# Patient Record
Sex: Male | Born: 1990 | Race: White | Hispanic: No | Marital: Single | Smoking: Never smoker
Health system: Southern US, Community
[De-identification: ages and names within clinical notes are randomized; demographics above are authoritative.]

---
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
## 2011-04-02 ENCOUNTER — Other Ambulatory Visit (INDEPENDENT_AMBULATORY_CARE_PROVIDER_SITE_OTHER): Payer: 59

## 2011-04-02 ENCOUNTER — Encounter: Payer: Self-pay | Admitting: Internal Medicine

## 2011-04-02 ENCOUNTER — Ambulatory Visit (INDEPENDENT_AMBULATORY_CARE_PROVIDER_SITE_OTHER): Payer: 59 | Admitting: Internal Medicine

## 2011-04-02 VITALS — BP 120/70 | HR 68 | Temp 98.0°F | Resp 16 | Wt 151.0 lb

## 2011-04-02 DIAGNOSIS — R209 Unspecified disturbances of skin sensation: Secondary | ICD-10-CM

## 2011-04-02 DIAGNOSIS — L708 Other acne: Secondary | ICD-10-CM

## 2011-04-02 DIAGNOSIS — R748 Abnormal levels of other serum enzymes: Secondary | ICD-10-CM

## 2011-04-02 DIAGNOSIS — L700 Acne vulgaris: Secondary | ICD-10-CM

## 2011-04-02 DIAGNOSIS — R202 Paresthesia of skin: Secondary | ICD-10-CM

## 2011-04-02 LAB — CBC WITH DIFFERENTIAL/PLATELET
Basophils Relative: 0.3 % (ref 0.0–3.0)
Eosinophils Relative: 3.6 % (ref 0.0–5.0)
HCT: 44.3 % (ref 39.0–52.0)
Lymphs Abs: 2.4 10*3/uL (ref 0.7–4.0)
MCV: 93.5 fl (ref 78.0–100.0)
Monocytes Absolute: 0.9 10*3/uL (ref 0.1–1.0)
Neutrophils Relative %: 58 % (ref 43.0–77.0)
RBC: 4.74 Mil/uL (ref 4.22–5.81)
WBC: 8.8 10*3/uL (ref 4.5–10.5)

## 2011-04-02 MED ORDER — CEPHALEXIN 500 MG PO CAPS: 500.0000 mg | ORAL_CAPSULE | Freq: Two times a day (BID) | ORAL | Status: DC

## 2011-04-02 NOTE — Progress Notes (Signed)
  Subjective:    Patient ID: Nicholas Mclean, male    DOB: March 13, 1990, 21 y.o.   MRN: [id number]  HPI    Review of Systems     Objective:   Physical Exam        Assessment & Plan:

## 2011-04-03 LAB — COMPREHENSIVE METABOLIC PANEL
CO2: 27 mEq/L (ref 19–32)
Calcium: 9.3 mg/dL (ref 8.4–10.5)
Chloride: 103 mEq/L (ref 96–112)
GFR: 110.7 mL/min (ref >60.00)
Glucose, Bld: 87 mg/dL (ref 70–99)
Sodium: 139 mEq/L (ref 135–145)
Total Bilirubin: 0.6 mg/dL (ref 0.3–1.2)
Total Protein: 7.2 g/dL (ref 6.0–8.3)

## 2011-04-04 ENCOUNTER — Telehealth: Payer: Self-pay | Admitting: Internal Medicine

## 2011-04-04 DIAGNOSIS — R748 Abnormal levels of other serum enzymes: Secondary | ICD-10-CM

## 2011-04-04 NOTE — Telephone Encounter (Signed)
Mother informed Will repeat in 10 d

## 2011-04-08 DIAGNOSIS — R202 Paresthesia of skin: Secondary | ICD-10-CM | POA: Insufficient documentation

## 2011-04-08 DIAGNOSIS — R748 Abnormal levels of other serum enzymes: Secondary | ICD-10-CM | POA: Insufficient documentation

## 2011-04-08 NOTE — Assessment & Plan Note (Signed)
D/c Erythromycin 3/13 due to paresthesia and ineffectiveness See med change

## 2011-04-08 NOTE — Progress Notes (Signed)
Patient ID: Nicholas Mclean, male   DOB: [birth year], 21 y.o.   MRN: [id number]  Subjective:    Patient ID: Nicholas Mclean, male    DOB: [birth year], 21 y.o.   MRN: [id number]  HPI C/o an episode of waking up with R foot numbness that started >24 h ago. He also had R hand numbness that resolved. He woke up this way in the morning. No pain or swelling and no injury. C/o acne x 2 years - not much better. Previous treatments with Minocycline, Doxy and Retin-A did not work. Now is on Erythromycin  Review of Systems  Constitutional: Negative for fever, chills, appetite change and fatigue.  HENT: Negative for nosebleeds, congestion, rhinorrhea, neck pain, neck stiffness and sinus pressure.   Eyes: Negative for pain and visual disturbance.  Respiratory: Negative for chest tightness and shortness of breath.   Cardiovascular: Negative for palpitations.  Gastrointestinal: Negative for abdominal pain.  Genitourinary: Negative for difficulty urinating.  Musculoskeletal: Negative for myalgias, back pain, joint swelling, arthralgias and gait problem.  Skin: Negative for color change and pallor.  Neurological: Negative for dizziness, tremors, seizures, syncope, facial asymmetry, speech difficulty, weakness, light-headedness, numbness and headaches.  Hematological: Negative for adenopathy. Does not bruise/bleed easily.  Psychiatric/Behavioral: Negative for suicidal ideas, confusion, sleep disturbance and agitation. The patient is not nervous/anxious.        Objective:   Physical Exam  Constitutional: He is oriented to person, place, and time. He appears well-developed and well-nourished.  Neurological: He is alert and oriented to person, place, and time. He has normal reflexes. He displays normal reflexes. No cranial nerve deficit. He exhibits normal muscle tone. Coordination normal.       R foot sensory exam revealed an area of decreased pinprick sensation over R lat, post and anterior heel extending laterally  and to the front of the foot   Skin: Rash noted.       Cystic acne - mostly over facial hair areas and between eyebrows The back is clear  Psychiatric: Judgment normal.   Lab Results  Component Value Date   WBC 8.8 04/02/2011   HGB 14.9 04/02/2011   HCT 44.3 04/02/2011   PLT 210.0 04/02/2011   GLUCOSE 87 04/02/2011   CHOL 131 05/24/2009   TRIG 126.0 05/24/2009   HDL 36.80* 05/24/2009   LDLCALC 69 05/24/2009   ALT 35 04/02/2011   AST 59* 04/02/2011   NA 139 04/02/2011   K 3.8 04/02/2011   CL 103 04/02/2011   CREATININE 0.9 04/02/2011   BUN 13 04/02/2011   CO2 27 04/02/2011   TSH 1.87 04/02/2011          Assessment & Plan:

## 2011-04-08 NOTE — Assessment & Plan Note (Addendum)
3/13 likely a lab artifact vs exercise induced Recheck CK, aldolase in 1 wk

## 2011-04-08 NOTE — Assessment & Plan Note (Signed)
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

## 2011-04-12 ENCOUNTER — Other Ambulatory Visit (INDEPENDENT_AMBULATORY_CARE_PROVIDER_SITE_OTHER): Payer: 59

## 2011-04-12 ENCOUNTER — Telehealth: Payer: Self-pay | Admitting: Internal Medicine

## 2011-04-12 DIAGNOSIS — R748 Abnormal levels of other serum enzymes: Secondary | ICD-10-CM

## 2011-04-12 NOTE — Telephone Encounter (Signed)
Please, inform patient's mom Celeste  that his CK is nl Thx

## 2011-04-14 LAB — ALDOLASE: Aldolase: 5.6 U/L (ref <=8.1)

## 2011-04-18 NOTE — Telephone Encounter (Signed)
Pt's father informed that CK was normal.

## 2011-06-14 ENCOUNTER — Ambulatory Visit: Payer: 59 | Admitting: Internal Medicine

## 2011-06-21 ENCOUNTER — Ambulatory Visit: Payer: 59 | Admitting: Internal Medicine

## 2011-07-20 ENCOUNTER — Ambulatory Visit (INDEPENDENT_AMBULATORY_CARE_PROVIDER_SITE_OTHER): Payer: BC Managed Care – PPO | Admitting: Internal Medicine

## 2011-07-20 ENCOUNTER — Encounter: Payer: Self-pay | Admitting: Internal Medicine

## 2011-07-20 VITALS — BP 110/70 | HR 76 | Temp 97.6°F | Resp 16 | Wt 143.0 lb

## 2011-07-20 DIAGNOSIS — Z23 Encounter for immunization: Secondary | ICD-10-CM

## 2011-07-20 DIAGNOSIS — L700 Acne vulgaris: Secondary | ICD-10-CM

## 2011-07-20 DIAGNOSIS — R202 Paresthesia of skin: Secondary | ICD-10-CM

## 2011-07-20 DIAGNOSIS — Z0000 Encounter for general adult medical examination without abnormal findings: Secondary | ICD-10-CM

## 2011-07-20 DIAGNOSIS — Z111 Encounter for screening for respiratory tuberculosis: Secondary | ICD-10-CM

## 2011-07-20 DIAGNOSIS — L708 Other acne: Secondary | ICD-10-CM

## 2011-07-20 DIAGNOSIS — R209 Unspecified disturbances of skin sensation: Secondary | ICD-10-CM

## 2011-07-20 NOTE — Progress Notes (Signed)
  Subjective:    Patient ID: Nicholas Mclean, male    DOB: 1990/05/13, 21 y.o.   MRN: [id number]  HPI  The patient is here for a wellness exam. The patient has been doing well overall without major physical or psychological issues going on lately.  Wt Readings from Last 3 Encounters:  07/20/11 143 lb (64.864 kg) (30.06%*)  04/02/11 151 lb (68.493 kg) (44.93%*)  12/04/10 149 lb (67.586 kg) (43.49%*)   * Growth percentiles are based on CDC 2-20 Years data.   BP Readings from Last 3 Encounters:  07/20/11 110/70  04/02/11 120/70  12/04/10 116/72     Review of Systems  Constitutional: Negative for appetite change, fatigue and unexpected weight change.  HENT: Negative for hearing loss, ear pain, nosebleeds, congestion, sore throat, sneezing, trouble swallowing, neck pain and neck stiffness.   Eyes: Negative for itching and visual disturbance.  Respiratory: Negative for cough, chest tightness and wheezing.   Cardiovascular: Negative for chest pain, palpitations and leg swelling.  Gastrointestinal: Negative for nausea, abdominal pain, diarrhea, blood in stool and abdominal distention.  Genitourinary: Negative for frequency, hematuria, discharge and testicular pain.  Musculoskeletal: Negative for back pain, joint swelling and gait problem.  Skin: Negative for rash and wound.  Neurological: Negative for dizziness, tremors, speech difficulty, weakness, numbness and headaches.  Psychiatric/Behavioral: Negative for suicidal ideas, disturbed wake/sleep cycle, dysphoric mood and agitation. The patient is not nervous/anxious.        Objective:   Physical Exam  Constitutional: He is oriented to person, place, and time. He appears well-developed and well-nourished.  HENT:  Mouth/Throat: Oropharynx is clear and moist.  Eyes: Conjunctivae are normal. Pupils are equal, round, and reactive to light.  Neck: Normal range of motion. No JVD present. No thyromegaly present.  Cardiovascular: Normal rate,  regular rhythm, normal heart sounds and intact distal pulses.  Exam reveals no gallop and no friction rub.   No murmur heard. Pulmonary/Chest: Effort normal and breath sounds normal. No respiratory distress. He has no wheezes. He has no rales. He exhibits no tenderness.  Abdominal: Soft. Bowel sounds are normal. He exhibits no distension and no mass. There is no tenderness. There is no rebound and no guarding.  Genitourinary:       Self testes exam was nl  Musculoskeletal: Normal range of motion. He exhibits no edema and no tenderness.  Lymphadenopathy:    He has no cervical adenopathy.  Neurological: He is alert and oriented to person, place, and time. He has normal reflexes. No cranial nerve deficit. He exhibits normal muscle tone. Coordination normal.  Skin: Skin is warm and dry. No rash noted.  Psychiatric: He has a normal mood and affect. His behavior is normal. Judgment and thought content normal.   Lab Results  Component Value Date   WBC 8.8 04/02/2011   HGB 14.9 04/02/2011   HCT 44.3 04/02/2011   PLT 210.0 04/02/2011   GLUCOSE 87 04/02/2011   CHOL 131 05/24/2009   TRIG 126.0 05/24/2009   HDL 36.80* 05/24/2009   LDLCALC 69 05/24/2009   ALT 35 04/02/2011   AST 59* 04/02/2011   NA 139 04/02/2011   K 3.8 04/02/2011   CL 103 04/02/2011   CREATININE 0.9 04/02/2011   BUN 13 04/02/2011   CO2 27 04/02/2011   TSH 1.87 04/02/2011          Assessment & Plan:

## 2011-07-24 ENCOUNTER — Ambulatory Visit: Payer: BC Managed Care – PPO

## 2011-07-24 DIAGNOSIS — Z23 Encounter for immunization: Secondary | ICD-10-CM | POA: Diagnosis not present

## 2011-07-24 LAB — TB SKIN TEST

## 2011-07-25 DIAGNOSIS — Z0000 Encounter for general adult medical examination without abnormal findings: Secondary | ICD-10-CM | POA: Insufficient documentation

## 2011-07-25 NOTE — Assessment & Plan Note (Signed)
Improved a lot 

## 2011-07-25 NOTE — Assessment & Plan Note (Signed)
We discussed age appropriate health related issues, including available/recomended screening tests and vaccinations. We discussed a need for adhering to healthy diet and exercise. Labs were reviewed/ordered. All questions were answered. Age and sex related issues discussed (safe sex, seat belt use, etc.). Gardasil suggested, info given. PPD

## 2011-07-25 NOTE — Assessment & Plan Note (Signed)
Resolved completely

## 2011-07-26 ENCOUNTER — Telehealth: Payer: Self-pay | Admitting: *Deleted

## 2011-07-26 DIAGNOSIS — Z0000 Encounter for general adult medical examination without abnormal findings: Secondary | ICD-10-CM

## 2011-07-26 NOTE — Telephone Encounter (Signed)
Received staff msg pt made apx for 07/25/12. Nedd labs entered.Marland KitchenMarland Kitchen7/11/13@2 :42pm/LMB

## 2011-07-26 NOTE — Telephone Encounter (Signed)
Message copied by Deatra James on Thu Jul 26, 2011  2:41 PM ------      Message from: COUSIN, [place] T      Created: Fri Jul 20, 2011 11:46 AM      Regarding: PHY DATE 07/25/12       THANKS

## 2011-12-31 ENCOUNTER — Encounter: Payer: Self-pay | Admitting: Internal Medicine

## 2011-12-31 ENCOUNTER — Ambulatory Visit (INDEPENDENT_AMBULATORY_CARE_PROVIDER_SITE_OTHER): Payer: BC Managed Care – PPO | Admitting: Internal Medicine

## 2011-12-31 VITALS — BP 112/72 | HR 51 | Temp 97.8°F | Ht 71.5 in | Wt 145.0 lb

## 2011-12-31 DIAGNOSIS — R05 Cough: Secondary | ICD-10-CM

## 2011-12-31 DIAGNOSIS — R059 Cough, unspecified: Secondary | ICD-10-CM

## 2011-12-31 DIAGNOSIS — J019 Acute sinusitis, unspecified: Secondary | ICD-10-CM

## 2011-12-31 MED ORDER — HYDROCODONE-HOMATROPINE 5-1.5 MG/5ML PO SYRP: 5.0000 mL | ORAL_SOLUTION | Freq: Three times a day (TID) | ORAL | Status: DC | PRN

## 2011-12-31 MED ORDER — AMOXICILLIN-POT CLAVULANATE 875-125 MG PO TABS: 1.0000 | ORAL_TABLET | Freq: Two times a day (BID) | ORAL | Status: DC

## 2011-12-31 NOTE — Progress Notes (Signed)
HPI  Pt presents to the clinic today with c/o sinus pain and pressure. This started 4 days ago. He also c/o of fever and headache. He has taken tylenol with only mild relief. He is also taking Mucinex which is causing him to cough up thick green sputum. Nothing makes it worse, it's just not getting any better. He has no history of allergies or asthma. He has had sick contacts. He did have similar symptoms 1 month ago and was treated with an antibiotic. He does not remember the name.  Review of Systems   No past medical history on file.  Family History  Problem Relation Age of Onset  . Multiple sclerosis Mother   . Melanoma Father     History   Social History  . Marital Status: Single    Spouse Name: N/A    Number of Children: N/A  . Years of Education: N/A   Occupational History  . Not on file.   Social History Main Topics  . Smoking status: Never Smoker   . Smokeless tobacco: Not on file  . Alcohol Use: No  . Drug Use: No  . Sexually Active:    Other Topics Concern  . Not on file   Social History Narrative  . No narrative on file    No Known Allergies   Constitutional: Positive headache, fatigue and fever. Denies abrupt weight changes.  HEENT:  Positive eye pain, pressure behind the eyes, facial pain, nasal congestion and sore throat. Denies eye redness, ear pain, ringing in the ears, wax buildup, runny nose or bloody nose. Respiratory: Positive cough and thick green sputum production. Denies difficulty breathing or shortness of breath.  Cardiovascular: Denies chest pain, chest tightness, palpitations or swelling in the hands or feet.   No other specific complaints in a complete review of systems (except as listed in HPI above).  Objective:    General: Appears his stated age, well developed, well nourished in NAD. HEENT: Head: normal shape and size; Eyes: sclera white, no icterus, conjunctiva pink, PERRLA and EOMs intact; Ears: Tm's gray and intact, normal light  reflex; Nose: mucosa pink and moist, septum midline; Throat/Mouth: + PND. Teeth present, mucosa pink and moist, no exudate noted, no lesions or ulcerations noted.  Neck: Mild cervical lymphadenopathy. Neck supple, trachea midline. No massses, lumps or thyromegaly present.  Cardiovascular: Normal rate and rhythm. S1,S2 noted.  No murmur, rubs or gallops noted. No JVD or BLE edema. No carotid bruits noted. Pulmonary/Chest: Normal effort and positive vesicular breath sounds. No respiratory distress. No wheezes, rales or ronchi noted.      Assessment & Plan:   Acute bacterial sinusitis  Can use a Neti Pot which can be purchased from your local drug store. Flonase 2 sprays each nostril for 3 days and then as needed. Augmentin BID for 10 days  RTC as needed or if symptoms persist.

## 2011-12-31 NOTE — Patient Instructions (Signed)

## 2012-01-22 ENCOUNTER — Emergency Department (HOSPITAL_COMMUNITY)
Admission: EM | Admit: 2012-01-22 | Discharge: 2012-01-23 | Disposition: A | Discharge status: Home / Self Care | Payer: BC Managed Care – PPO | Attending: Emergency Medicine | Admitting: Emergency Medicine

## 2012-01-22 ENCOUNTER — Emergency Department (HOSPITAL_COMMUNITY): Payer: BC Managed Care – PPO

## 2012-01-22 ENCOUNTER — Encounter (HOSPITAL_COMMUNITY): Payer: Self-pay | Admitting: *Deleted

## 2012-01-22 DIAGNOSIS — Z79899 Other long term (current) drug therapy: Secondary | ICD-10-CM | POA: Insufficient documentation

## 2012-01-22 DIAGNOSIS — S060X0A Concussion without loss of consciousness, initial encounter: Secondary | ICD-10-CM

## 2012-01-22 DIAGNOSIS — S060X9A Concussion with loss of consciousness of unspecified duration, initial encounter: Secondary | ICD-10-CM | POA: Insufficient documentation

## 2012-01-22 DIAGNOSIS — IMO0002 Reserved for concepts with insufficient information to code with codable children: Secondary | ICD-10-CM

## 2012-01-22 DIAGNOSIS — Y929 Unspecified place or not applicable: Secondary | ICD-10-CM | POA: Insufficient documentation

## 2012-01-22 DIAGNOSIS — W108XXA Fall (on) (from) other stairs and steps, initial encounter: Secondary | ICD-10-CM | POA: Insufficient documentation

## 2012-01-22 DIAGNOSIS — S0003XA Contusion of scalp, initial encounter: Secondary | ICD-10-CM | POA: Insufficient documentation

## 2012-01-22 DIAGNOSIS — S060XAA Concussion with loss of consciousness status unknown, initial encounter: Secondary | ICD-10-CM | POA: Insufficient documentation

## 2012-01-22 DIAGNOSIS — S1093XA Contusion of unspecified part of neck, initial encounter: Secondary | ICD-10-CM | POA: Insufficient documentation

## 2012-01-22 DIAGNOSIS — R51 Headache: Secondary | ICD-10-CM | POA: Insufficient documentation

## 2012-01-22 DIAGNOSIS — Y939 Activity, unspecified: Secondary | ICD-10-CM | POA: Insufficient documentation

## 2012-01-22 DIAGNOSIS — T148XXA Other injury of unspecified body region, initial encounter: Secondary | ICD-10-CM

## 2012-01-22 NOTE — ED Notes (Signed)
Pt tripped over a dog; falling down about 4 steps; presents with laceration above left eyebrow; swelling to nose; abrasions to nose/chin/forehead; c/o neck soreness; c collar placed in triage; pt seen by Herbert Seta PA in triage; pt unsure of loss of consciousness

## 2012-01-23 ENCOUNTER — Emergency Department (HOSPITAL_COMMUNITY): Payer: BC Managed Care – PPO

## 2012-01-23 MED ORDER — BACITRACIN ZINC 500 UNIT/GM EX OINT
TOPICAL_OINTMENT | Freq: Two times a day (BID) | CUTANEOUS | Status: DC
Start: 2012-01-23 — End: 2012-01-30

## 2012-01-23 MED ORDER — HYDROCODONE-ACETAMINOPHEN 5-325 MG PO TABS
ORAL_TABLET | ORAL | Status: AC
  Filled 2012-01-23: qty 2

## 2012-01-23 MED ORDER — HYDROCODONE-ACETAMINOPHEN 5-325 MG PO TABS: 1.0000 | ORAL_TABLET | Freq: Four times a day (QID) | ORAL | Status: DC | PRN

## 2012-01-23 MED ORDER — IBUPROFEN 600 MG PO TABS: 600.0000 mg | ORAL_TABLET | Freq: Four times a day (QID) | ORAL | Status: DC | PRN

## 2012-01-23 NOTE — ED Notes (Signed)
C-collar placed in triage at 2343

## 2012-01-23 NOTE — ED Provider Notes (Signed)
History     CSN: [id number]  Arrival date & time 01/22/12  2329   First MD Initiated Contact with Patient 01/23/12 0004      No chief complaint on file.   (Consider location/radiation/quality/duration/timing/severity/associated sxs/prior treatment) HPI Comments: Pt comes in with cc of fall. Pt states that he tripped over a dog, and fell face forward down the stairs. No LOC, but he does have a headaches. Pt has no nausea, vomiting, visual complains, seizures, altered mental status, loss of consciousness, new weakness, or numbness, no gait instability. No numbness, tingling. Denies intoxication.  The history is provided by the patient.    History reviewed. No pertinent past medical history.  History reviewed. No pertinent past surgical history.  Family History  Problem Relation Age of Onset  . Multiple sclerosis Mother   . Melanoma Father     History  Substance Use Topics  . Smoking status: Never Smoker   . Smokeless tobacco: Not on file  . Alcohol Use: No      Review of Systems  Constitutional: Negative for activity change and appetite change.  Respiratory: Negative for cough and shortness of breath.   Cardiovascular: Negative for chest pain.  Gastrointestinal: Negative for abdominal pain.  Genitourinary: Negative for dysuria.  Musculoskeletal: Positive for arthralgias. Negative for back pain.  Neurological: Positive for headaches.  Hematological: Does not bruise/bleed easily.    Allergies  Review of patient's allergies indicates no known allergies.  Home Medications   Current Outpatient Rx  Name  Route  Sig  Dispense  Refill  . BACITRACIN ZINC 500 UNIT/GM EX OINT   Topical   Apply topically 2 (two) times daily.   120 g   0   . HYDROCODONE-ACETAMINOPHEN 5-325 MG PO TABS   Oral   Take 1 tablet by mouth every 6 (six) hours as needed for pain.   6 tablet   0   . IBUPROFEN 600 MG PO TABS   Oral   Take 1 tablet (600 mg total) by mouth every 6 (six)  hours as needed for pain.   30 tablet   0     BP 126/60  Pulse 80  Temp 97.9 F (36.6 C)  Resp 20  SpO2 100%  Physical Exam  Nursing note and vitals reviewed. Constitutional: He is oriented to person, place, and time. He appears well-developed.  HENT:  Head: Normocephalic and atraumatic.  Eyes: Conjunctivae normal and EOM are normal. Pupils are equal, round, and reactive to light.  Neck: Normal range of motion. Neck supple.  Cardiovascular: Normal rate and regular rhythm.   Pulmonary/Chest: Effort normal and breath sounds normal.  Abdominal: Soft. Bowel sounds are normal. He exhibits no distension. There is no tenderness. There is no rebound and no guarding.  Musculoskeletal:       Head to toe evaluation shows forehead hematoma and a laceration above the left eye - about 4 cm in length, deep with no bleeding of the scalp, no facial abrasions, step offs, crepitus, no tenderness to palpation of the bilateral upper and lower extremities, no gross deformities, no chest tenderness, no pelvic pain.  Neurological: He is alert and oriented to person, place, and time.  Skin: Skin is warm.    ED Course  Procedures (including critical care time)  Labs Reviewed - No data to display Dg Cervical Spine Complete  01/23/2012  *RADIOLOGY REPORT*  Clinical Data: Fall.  Neck pain.  CERVICAL SPINE - COMPLETE 4+ VIEW  Comparison: None.  Findings: Vertebral  body height and alignment are normal. Intervertebral disc space height is maintained.  Prevertebral soft tissues appear normal.  Lung apices clear.  IMPRESSION: Negative exam.   Original Report Authenticated By: Holley Dexter, M.D.    Ct Head Wo Contrast  01/23/2012  *RADIOLOGY REPORT*  Clinical Data:  Larey Seat down steps.  Head pain.  Neck pain.  CT HEAD WITHOUT CONTRAST CT CERVICAL SPINE WITHOUT CONTRAST  Technique:  Multidetector CT imaging of the head and cervical spine was performed following the standard protocol without intravenous contrast.   Multiplanar CT image reconstructions of the cervical spine were also generated.  Comparison:   None  CT HEAD  Findings: There is no evidence for acute infarction, intracranial hemorrhage, mass lesion, hydrocephalus, or extra-axial fluid. There is no atrophy or white matter disease.  Calvarium is intact. There is a left frontal supraorbital scalp hematoma without underlying sinus opacity or visible orbital injury.  IMPRESSION: Left frontal scalp hematoma.  No skull fracture or intracranial hemorrhage.  CT CERVICAL SPINE  Findings: No visible cervical spine fracture or traumatic subluxation.  No intraspinal hematoma or traumatic subluxation. The  airway is midline.  No neck masses.  Clear lung apices. Intervertebral disc spaces are maintained throughout. Craniocervical junction unremarkable.  IMPRESSION: Negative.   Original Report Authenticated By: Davonna Belling, M.D.    Ct Cervical Spine Wo Contrast  01/23/2012  *RADIOLOGY REPORT*  Clinical Data:  Larey Seat down steps.  Head pain.  Neck pain.  CT HEAD WITHOUT CONTRAST CT CERVICAL SPINE WITHOUT CONTRAST  Technique:  Multidetector CT imaging of the head and cervical spine was performed following the standard protocol without intravenous contrast.  Multiplanar CT image reconstructions of the cervical spine were also generated.  Comparison:   None  CT HEAD  Findings: There is no evidence for acute infarction, intracranial hemorrhage, mass lesion, hydrocephalus, or extra-axial fluid. There is no atrophy or white matter disease.  Calvarium is intact. There is a left frontal supraorbital scalp hematoma without underlying sinus opacity or visible orbital injury.  IMPRESSION: Left frontal scalp hematoma.  No skull fracture or intracranial hemorrhage.  CT CERVICAL SPINE  Findings: No visible cervical spine fracture or traumatic subluxation.  No intraspinal hematoma or traumatic subluxation. The  airway is midline.  No neck masses.  Clear lung apices. Intervertebral disc spaces are  maintained throughout. Craniocervical junction unremarkable.  IMPRESSION: Negative.   Original Report Authenticated By: Davonna Belling, M.D.      1. Fall down stairs   2. Laceration   3. Contusion   4. Hematoma   5. Concussion with no loss of consciousness       MDM  Pt comes in after a fall. CT head and spine ordered per [place] head rules and NEXUS criteria. Will need lac repair. UTD with tetanus.   LACERATION REPAIR Performed by: Derwood Kaplan Authorized by: Derwood Kaplan Consent: Verbal consent obtained. Risks and benefits: risks, benefits and alternatives were discussed Consent given by: patient Patient identity confirmed: provided demographic data Prepped and Draped in normal sterile fashion Wound explored  Laceration Location: left forehead  Laceration Length: 4cm  No Foreign Bodies seen or palpated  Anesthesia: local infiltration  Local anesthetic: lidocaine 1% with epinephrine  Anesthetic total: 2 ml  Irrigation method: syringe Amount of cleaning: standard  Skin closure: Nylon, 5-0  Number of sutures: 5  Technique: Simple interrupted  Patient tolerance: Patient tolerated the procedure well with no immediate complications.  Derwood Kaplan, MD 01/23/12 [phone]

## 2012-01-25 ENCOUNTER — Encounter: Payer: Self-pay | Admitting: Internal Medicine

## 2012-01-30 ENCOUNTER — Ambulatory Visit (INDEPENDENT_AMBULATORY_CARE_PROVIDER_SITE_OTHER): Payer: BC Managed Care – PPO | Admitting: Internal Medicine

## 2012-01-30 ENCOUNTER — Encounter: Payer: Self-pay | Admitting: Internal Medicine

## 2012-01-30 VITALS — BP 110/60 | HR 80 | Temp 97.9°F | Resp 16 | Wt 144.0 lb

## 2012-01-30 DIAGNOSIS — S060XAA Concussion with loss of consciousness status unknown, initial encounter: Secondary | ICD-10-CM | POA: Insufficient documentation

## 2012-01-30 DIAGNOSIS — R5383 Other fatigue: Secondary | ICD-10-CM

## 2012-01-30 DIAGNOSIS — F988 Other specified behavioral and emotional disorders with onset usually occurring in childhood and adolescence: Secondary | ICD-10-CM

## 2012-01-30 DIAGNOSIS — R5381 Other malaise: Secondary | ICD-10-CM

## 2012-01-30 DIAGNOSIS — S060X9A Concussion with loss of consciousness of unspecified duration, initial encounter: Secondary | ICD-10-CM | POA: Insufficient documentation

## 2012-01-30 MED ORDER — VITAMIN D 1000 UNITS PO TABS: 1000.0000 [IU] | ORAL_TABLET | Freq: Every day | ORAL | Status: DC

## 2012-01-30 MED ORDER — PHOSPHATIDYLSERINE-DHA-EPA 100-19.5-6.5 MG PO CAPS: 1.0000 | ORAL_CAPSULE | Freq: Every day | ORAL | Status: DC

## 2012-01-30 NOTE — Assessment & Plan Note (Signed)
Will discuss Rx

## 2012-01-30 NOTE — Assessment & Plan Note (Signed)
Discussed Start Vayacase qd x 2 mo Rest more No alcohol x 2-3 mo

## 2012-01-30 NOTE — Progress Notes (Signed)
  Subjective:   HPI  Nicholas Mclean is here to f/u on his concussion - s/p fall down the steps  On his face (tripped over a dog). C/o lack of focus in class, daydreeming and poor memory x long time.  Wt Readings from Last 3 Encounters:  01/30/12 144 lb (65.318 kg)  12/31/11 145 lb (65.772 kg)  07/20/11 143 lb (64.864 kg) (30.06%*)   * Growth percentiles are based on CDC 2-20 Years data.   BP Readings from Last 3 Encounters:  01/30/12 110/60  01/23/12 115/69  12/31/11 112/72     Review of Systems  Constitutional: Negative for appetite change, fatigue and unexpected weight change.  HENT: Negative for hearing loss, ear pain, nosebleeds, congestion, sore throat, sneezing, trouble swallowing, neck pain and neck stiffness.   Eyes: Negative for itching and visual disturbance.  Respiratory: Negative for cough, chest tightness and wheezing.   Cardiovascular: Negative for chest pain, palpitations and leg swelling.  Gastrointestinal: Negative for nausea, abdominal pain, diarrhea, blood in stool and abdominal distention.  Genitourinary: Negative for frequency, hematuria, discharge and testicular pain.  Musculoskeletal: Negative for back pain, joint swelling and gait problem.  Skin: Negative for rash and wound.  Neurological: Negative for dizziness, tremors, speech difficulty, weakness, numbness and headaches.  Psychiatric/Behavioral: Positive for decreased concentration. Negative for suicidal ideas, sleep disturbance, dysphoric mood and agitation. The patient is not nervous/anxious.        Denies depression       Objective:   Physical Exam  Constitutional: He is oriented to person, place, and time. He appears well-developed and well-nourished.  HENT:  Mouth/Throat: Oropharynx is clear and moist.  Eyes: Conjunctivae normal are normal. Pupils are equal, round, and reactive to light.  Neck: Normal range of motion. No JVD present. No thyromegaly present.  Cardiovascular: Normal rate, regular  rhythm, normal heart sounds and intact distal pulses.  Exam reveals no gallop and no friction rub.   No murmur heard. Pulmonary/Chest: Effort normal and breath sounds normal. No respiratory distress. He has no wheezes. He has no rales. He exhibits no tenderness.  Abdominal: Soft. Bowel sounds are normal. He exhibits no distension and no mass. There is no tenderness. There is no rebound and no guarding.  Genitourinary:       Self testes exam was nl  Musculoskeletal: Normal range of motion. He exhibits no edema and no tenderness.  Lymphadenopathy:    He has no cervical adenopathy.  Neurological: He is alert and oriented to person, place, and time. He has normal reflexes. No cranial nerve deficit. He exhibits normal muscle tone. Coordination normal.  Skin: Skin is warm and dry. No rash noted.  Psychiatric: He has a normal mood and affect. His behavior is normal. Judgment and thought content normal.  bruised face - resolving, laceration on L eyebrow  - healing well Lab Results  Component Value Date   WBC 8.8 04/02/2011   HGB 14.9 04/02/2011   HCT 44.3 04/02/2011   PLT 210.0 04/02/2011   GLUCOSE 87 04/02/2011   CHOL 131 05/24/2009   TRIG 126.0 05/24/2009   HDL 36.80* 05/24/2009   LDLCALC 69 05/24/2009   ALT 35 04/02/2011   AST 59* 04/02/2011   NA 139 04/02/2011   K 3.8 04/02/2011   CL 103 04/02/2011   CREATININE 0.9 04/02/2011   BUN 13 04/02/2011   CO2 27 04/02/2011   TSH 1.87 04/02/2011          Assessment & Plan:

## 2012-01-30 NOTE — Assessment & Plan Note (Signed)
Labs

## 2012-03-01 ENCOUNTER — Other Ambulatory Visit: Payer: Self-pay

## 2012-03-05 ENCOUNTER — Encounter: Payer: Self-pay | Admitting: Internal Medicine

## 2012-03-05 ENCOUNTER — Ambulatory Visit (INDEPENDENT_AMBULATORY_CARE_PROVIDER_SITE_OTHER): Payer: BC Managed Care – PPO | Admitting: Internal Medicine

## 2012-03-05 VITALS — BP 120/70 | HR 76 | Temp 98.5°F | Resp 16 | Wt 146.0 lb

## 2012-03-05 DIAGNOSIS — R5381 Other malaise: Secondary | ICD-10-CM

## 2012-03-05 DIAGNOSIS — L700 Acne vulgaris: Secondary | ICD-10-CM

## 2012-03-05 DIAGNOSIS — L708 Other acne: Secondary | ICD-10-CM

## 2012-03-05 DIAGNOSIS — F988 Other specified behavioral and emotional disorders with onset usually occurring in childhood and adolescence: Secondary | ICD-10-CM

## 2012-03-05 DIAGNOSIS — R5383 Other fatigue: Secondary | ICD-10-CM

## 2012-03-05 DIAGNOSIS — S060X9A Concussion with loss of consciousness of unspecified duration, initial encounter: Secondary | ICD-10-CM

## 2012-03-05 MED ORDER — DEXMETHYLPHENIDATE HCL 2.5 MG PO TABS: 2.5000 mg | ORAL_TABLET | Freq: Two times a day (BID) | ORAL | Status: DC

## 2012-03-05 MED ORDER — ERYTHROMYCIN BASE 500 MG PO TABS: 500.0000 mg | ORAL_TABLET | Freq: Two times a day (BID) | ORAL | Status: AC

## 2012-03-05 NOTE — Progress Notes (Signed)
   Subjective:   HPI  Nature is here to f/u on his concussion - s/p fall down the steps on his face (tripped over a dog) on 01/22/12. C/o lack of focus in class, daydreeming and poor memory x long time. No HAs. He went skiing - ok C/o acne - he did well on Erythromycin in the past  Wt Readings from Last 3 Encounters:  03/05/12 146 lb (66.225 kg)  01/30/12 144 lb (65.318 kg)  12/31/11 145 lb (65.772 kg)   BP Readings from Last 3 Encounters:  03/05/12 120/70  01/30/12 110/60  01/23/12 115/69     Review of Systems  Constitutional: Negative for appetite change, fatigue and unexpected weight change.  HENT: Negative for hearing loss, ear pain, nosebleeds, congestion, sore throat, sneezing, trouble swallowing, neck pain and neck stiffness.   Eyes: Negative for itching and visual disturbance.  Respiratory: Negative for cough, chest tightness and wheezing.   Cardiovascular: Negative for chest pain, palpitations and leg swelling.  Gastrointestinal: Negative for nausea, abdominal pain, diarrhea, blood in stool and abdominal distention.  Genitourinary: Negative for frequency, hematuria, discharge and testicular pain.  Musculoskeletal: Negative for back pain, joint swelling and gait problem.  Skin: Negative for rash and wound.  Neurological: Negative for dizziness, tremors, speech difficulty, weakness, numbness and headaches.  Psychiatric/Behavioral: Positive for decreased concentration. Negative for suicidal ideas, sleep disturbance, dysphoric mood and agitation. The patient is not nervous/anxious.        Denies depression       Objective:   Physical Exam  Constitutional: He is oriented to person, place, and time. He appears well-developed and well-nourished.  HENT:  Mouth/Throat: Oropharynx is clear and moist.  Eyes: Conjunctivae are normal. Pupils are equal, round, and reactive to light.  Neck: Normal range of motion. No JVD present. No thyromegaly present.  Cardiovascular: Normal  rate, regular rhythm, normal heart sounds and intact distal pulses.  Exam reveals no gallop and no friction rub.   No murmur heard. Pulmonary/Chest: Effort normal and breath sounds normal. No respiratory distress. He has no wheezes. He has no rales. He exhibits no tenderness.  Abdominal: Soft. Bowel sounds are normal. He exhibits no distension and no mass. There is no tenderness. There is no rebound and no guarding.  Genitourinary:  Self testes exam was nl  Musculoskeletal: Normal range of motion. He exhibits no edema and no tenderness.  Lymphadenopathy:    He has no cervical adenopathy.  Neurological: He is alert and oriented to person, place, and time. He has normal reflexes. No cranial nerve deficit. He exhibits normal muscle tone. Coordination normal.  Skin: Skin is warm and dry. No rash noted.  Psychiatric: He has a normal mood and affect. His behavior is normal. Judgment and thought content normal.   laceration on L eyebrow  - healed well acne Lab Results  Component Value Date   WBC 8.8 04/02/2011   HGB 14.9 04/02/2011   HCT 44.3 04/02/2011   PLT 210.0 04/02/2011   GLUCOSE 87 04/02/2011   CHOL 131 05/24/2009   TRIG 126.0 05/24/2009   HDL 36.80* 05/24/2009   LDLCALC 69 05/24/2009   ALT 35 04/02/2011   AST 59* 04/02/2011   NA 139 04/02/2011   K 3.8 04/02/2011   CL 103 04/02/2011   CREATININE 0.9 04/02/2011   BUN 13 04/02/2011   CO2 27 04/02/2011   TSH 1.87 04/02/2011          Assessment & Plan:

## 2012-03-05 NOTE — Assessment & Plan Note (Signed)
   Potential benefits of a long term stimulants use as well as potential risks  and complications were explained to the patient and were aknowledged. Start Focalin

## 2012-03-05 NOTE — Assessment & Plan Note (Signed)
Recovering well 

## 2012-03-05 NOTE — Assessment & Plan Note (Addendum)
Worse off Rx. Re-start Erythromycin

## 2012-03-05 NOTE — Assessment & Plan Note (Signed)
Better  

## 2012-04-23 ENCOUNTER — Telehealth: Payer: Self-pay | Admitting: Internal Medicine

## 2012-04-23 MED ORDER — DEXMETHYLPHENIDATE HCL ER 5 MG PO CP24: 5.0000 mg | ORAL_CAPSULE | Freq: Every day | ORAL | Status: DC

## 2012-04-23 NOTE — Telephone Encounter (Signed)
Needs focalin - would like to have ER done

## 2012-04-24 NOTE — Telephone Encounter (Signed)
Pt's mother informed.  

## 2012-05-05 ENCOUNTER — Ambulatory Visit: Payer: BC Managed Care – PPO | Admitting: Internal Medicine

## 2012-05-05 DIAGNOSIS — Z0289 Encounter for other administrative examinations: Secondary | ICD-10-CM

## 2012-07-25 ENCOUNTER — Encounter: Payer: Self-pay | Admitting: Internal Medicine

## 2012-07-25 ENCOUNTER — Other Ambulatory Visit (INDEPENDENT_AMBULATORY_CARE_PROVIDER_SITE_OTHER): Payer: BC Managed Care – PPO

## 2012-07-25 ENCOUNTER — Ambulatory Visit (INDEPENDENT_AMBULATORY_CARE_PROVIDER_SITE_OTHER): Payer: BC Managed Care – PPO | Admitting: Internal Medicine

## 2012-07-25 VITALS — BP 118/70 | HR 76 | Temp 97.3°F | Resp 16 | Ht 72.0 in | Wt 145.0 lb

## 2012-07-25 DIAGNOSIS — Z0000 Encounter for general adult medical examination without abnormal findings: Secondary | ICD-10-CM

## 2012-07-25 DIAGNOSIS — S060X9A Concussion with loss of consciousness of unspecified duration, initial encounter: Secondary | ICD-10-CM

## 2012-07-25 DIAGNOSIS — R5381 Other malaise: Secondary | ICD-10-CM

## 2012-07-25 DIAGNOSIS — F988 Other specified behavioral and emotional disorders with onset usually occurring in childhood and adolescence: Secondary | ICD-10-CM

## 2012-07-25 DIAGNOSIS — R5383 Other fatigue: Secondary | ICD-10-CM

## 2012-07-25 LAB — BASIC METABOLIC PANEL
CO2: 28 mEq/L (ref 19–32)
Calcium: 10.9 mg/dL — ABNORMAL HIGH (ref 8.4–10.5)
Chloride: 96 mEq/L (ref 96–112)
Glucose, Bld: 92 mg/dL (ref 70–99)
Sodium: 144 mEq/L (ref 135–145)

## 2012-07-25 LAB — CBC WITH DIFFERENTIAL/PLATELET
Basophils Absolute: 0 10*3/uL (ref 0.0–0.1)
Basophils Relative: 0.3 % (ref 0.0–3.0)
Eosinophils Absolute: 0.3 10*3/uL (ref 0.0–0.7)
HCT: 45.6 % (ref 39.0–52.0)
Hemoglobin: 15.8 g/dL (ref 13.0–17.0)
Lymphocytes Relative: 25.1 % (ref 12.0–46.0)
Lymphs Abs: 2.1 10*3/uL (ref 0.7–4.0)
MCHC: 34.6 g/dL (ref 30.0–36.0)
MCV: 93.4 fl (ref 78.0–100.0)
Monocytes Absolute: 0.8 10*3/uL (ref 0.1–1.0)
Neutro Abs: 5.3 10*3/uL (ref 1.4–7.7)
RBC: 4.88 Mil/uL (ref 4.22–5.81)
RDW: 13.2 % (ref 11.5–14.6)

## 2012-07-25 LAB — URINALYSIS
Hgb urine dipstick: NEGATIVE
Ketones, ur: NEGATIVE
Total Protein, Urine: NEGATIVE
Urine Glucose: NEGATIVE
Urobilinogen, UA: 0.2 (ref 0.0–1.0)

## 2012-07-25 LAB — HEPATIC FUNCTION PANEL
ALT: 17 U/L (ref 0–53)
Albumin: 4.7 g/dL (ref 3.5–5.2)
Alkaline Phosphatase: 59 U/L (ref 39–117)
Total Protein: 7.6 g/dL (ref 6.0–8.3)

## 2012-07-25 LAB — LIPID PANEL: HDL: 53.8 mg/dL (ref >39.00)

## 2012-07-25 NOTE — Progress Notes (Signed)
   Subjective:    HPI  The patient is here for a wellness exam.   The patient has been doing well overall without major physical or psychological issues going on lately.  Wt Readings from Last 3 Encounters:  07/25/12 145 lb (65.772 kg)  03/05/12 146 lb (66.225 kg)  01/30/12 144 lb (65.318 kg)   BP Readings from Last 3 Encounters:  07/25/12 118/70  03/05/12 120/70  01/30/12 110/60     Review of Systems  Constitutional: Negative for appetite change, fatigue and unexpected weight change.  HENT: Negative for hearing loss, ear pain, nosebleeds, congestion, sore throat, sneezing, trouble swallowing, neck pain and neck stiffness.   Eyes: Negative for itching and visual disturbance.  Respiratory: Negative for cough, chest tightness and wheezing.   Cardiovascular: Negative for chest pain, palpitations and leg swelling.  Gastrointestinal: Negative for nausea, abdominal pain, diarrhea, blood in stool and abdominal distention.  Genitourinary: Negative for frequency, hematuria, discharge and testicular pain.  Musculoskeletal: Negative for back pain, joint swelling and gait problem.  Skin: Negative for rash and wound.  Neurological: Negative for dizziness, tremors, speech difficulty, weakness, numbness and headaches.  Psychiatric/Behavioral: Negative for suicidal ideas, behavioral problems, confusion, sleep disturbance, dysphoric mood, decreased concentration and agitation. The patient is not nervous/anxious.        Objective:   Physical Exam  Constitutional: He is oriented to person, place, and time. He appears well-developed and well-nourished.  Looks well  HENT:  Mouth/Throat: Oropharynx is clear and moist.  Eyes: Conjunctivae are normal. Pupils are equal, round, and reactive to light.  Neck: Normal range of motion. No JVD present. No thyromegaly present.  Cardiovascular: Normal rate, regular rhythm, normal heart sounds and intact distal pulses.  Exam reveals no gallop and no  friction rub.   No murmur heard. Pulmonary/Chest: Effort normal and breath sounds normal. No respiratory distress. He has no wheezes. He has no rales. He exhibits no tenderness.  Abdominal: Soft. Bowel sounds are normal. He exhibits no distension and no mass. There is no tenderness. There is no rebound and no guarding.  Genitourinary:  Self testes exam was nl  Musculoskeletal: Normal range of motion. He exhibits no edema and no tenderness.  Lymphadenopathy:    He has no cervical adenopathy.  Neurological: He is alert and oriented to person, place, and time. He has normal reflexes. No cranial nerve deficit. He exhibits normal muscle tone. Coordination normal.  Skin: Skin is warm and dry. No rash noted.  Psychiatric: He has a normal mood and affect. His behavior is normal. Judgment and thought content normal.   Lab Results  Component Value Date   WBC 8.8 04/02/2011   HGB 14.9 04/02/2011   HCT 44.3 04/02/2011   PLT 210.0 04/02/2011   GLUCOSE 87 04/02/2011   CHOL 131 05/24/2009   TRIG 126.0 05/24/2009   HDL 36.80* 05/24/2009   LDLCALC 69 05/24/2009   ALT 35 04/02/2011   AST 59* 04/02/2011   NA 139 04/02/2011   K 3.8 04/02/2011   CL 103 04/02/2011   CREATININE 0.9 04/02/2011   BUN 13 04/02/2011   CO2 27 04/02/2011   TSH 1.87 04/02/2011          Assessment & Plan:

## 2012-07-27 ENCOUNTER — Encounter: Payer: Self-pay | Admitting: Internal Medicine

## 2012-07-27 NOTE — Assessment & Plan Note (Signed)
Sx's resolved °

## 2012-07-27 NOTE — Assessment & Plan Note (Signed)
We discussed age appropriate health related issues, including available/recomended screening tests and vaccinations. We discussed a need for adhering to healthy diet and exercise. Labs were reviewed/ordered. All questions were answered. Age and sex related issues discussed (safe sex, seat belt use, etc.). 

## 2012-07-27 NOTE — Assessment & Plan Note (Signed)
No complains today - off rx

## 2012-07-27 NOTE — Assessment & Plan Note (Signed)
Resolved

## 2012-11-20 ENCOUNTER — Other Ambulatory Visit: Payer: Self-pay

## 2013-07-28 ENCOUNTER — Encounter: Payer: BC Managed Care – PPO | Admitting: Internal Medicine

## 2013-12-09 ENCOUNTER — Ambulatory Visit (INDEPENDENT_AMBULATORY_CARE_PROVIDER_SITE_OTHER): Payer: BC Managed Care – PPO | Admitting: Internal Medicine

## 2013-12-09 ENCOUNTER — Encounter: Payer: Self-pay | Admitting: Internal Medicine

## 2013-12-09 VITALS — BP 112/70 | HR 62 | Temp 98.3°F | Wt 141.0 lb

## 2013-12-09 DIAGNOSIS — F411 Generalized anxiety disorder: Secondary | ICD-10-CM

## 2013-12-09 DIAGNOSIS — G8929 Other chronic pain: Secondary | ICD-10-CM

## 2013-12-09 DIAGNOSIS — R1013 Epigastric pain: Secondary | ICD-10-CM

## 2013-12-09 MED ORDER — RANITIDINE HCL 150 MG PO TABS: 150.0000 mg | ORAL_TABLET | Freq: Two times a day (BID) | ORAL | Status: DC

## 2013-12-09 MED ORDER — VITAMIN D 1000 UNITS PO TABS: 1000.0000 [IU] | ORAL_TABLET | Freq: Every day | ORAL | Status: AC

## 2013-12-09 MED ORDER — BUSPIRONE HCL 10 MG PO TABS: 10.0000 mg | ORAL_TABLET | Freq: Two times a day (BID) | ORAL | Status: DC

## 2013-12-09 NOTE — Progress Notes (Signed)
Pre visit review using our clinic review tool, if applicable. No additional management support is needed unless otherwise documented below in the visit note. 

## 2013-12-09 NOTE — Patient Instructions (Signed)
Eat well Start exercising Take Ranitidine

## 2013-12-09 NOTE — Progress Notes (Signed)
Subjective:    Abdominal Pain This is a recurrent problem. The current episode started more than 1 month ago (since summer 2015). The onset quality is undetermined. The problem occurs 2 to 4 times per day. The most recent episode lasted 2 hours. The problem has been waxing and waning. The pain is located in the generalized abdominal region. The pain is at a severity of 5/10. The pain is moderate. The quality of the pain is cramping and aching. The abdominal pain does not radiate. Associated symptoms include anorexia, nausea, vomiting and weight loss. Pertinent negatives include no belching, diarrhea, frequency, headaches, hematochezia, hematuria or melena. The pain is aggravated by vomiting. He has tried antacids for the symptoms. The treatment provided mild relief. Prior diagnostic workup includes GI consult, upper endoscopy, ultrasound and CT scan (HIDA). There is no history of abdominal surgery, colon cancer, Crohn's disease, gallstones, GERD, pancreatitis, PUD or ulcerative colitis.  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 Negative for appetite change, fatigue and unexpected weight change.  HENT: Negative for congestion, ear pain, hearing loss, nosebleeds, sneezing, sore throat and trouble swallowing.   Eyes: Negative for itching and visual disturbance.  Respiratory: Negative for cough, chest tightness and wheezing.   Cardiovascular: Negative for chest pain, palpitations and leg swelling.  Gastrointestinal: Positive for nausea, vomiting and anorexia. Negative for abdominal pain, diarrhea, blood in stool, melena, hematochezia and abdominal distention.  Genitourinary: Negative for frequency,  hematuria, discharge and testicular pain.  Musculoskeletal: Negative for back pain, joint swelling, gait problem, neck pain and neck stiffness.  Skin: Negative for rash and wound.  Neurological: Negative for dizziness, tremors, speech difficulty, weakness, numbness and headaches.  Psychiatric/Behavioral: Negative for suicidal ideas, behavioral problems, confusion, sleep disturbance, dysphoric mood, decreased concentration and agitation. The patient is not nervous/anxious.        Objective:   Physical Exam  Constitutional: He is oriented to person, place, and time. He appears well-developed. No distress.  Thin NAD  HENT:  Mouth/Throat: Oropharynx is clear and moist.  Eyes: Conjunctivae are normal. Pupils are equal, round, and reactive to light.  Neck: Normal range of motion. No JVD present. No thyromegaly present.  Cardiovascular: Normal rate, regular rhythm, normal heart sounds and intact distal pulses.  Exam reveals no gallop and no friction rub.   No murmur heard. Pulmonary/Chest: Effort normal and breath sounds normal. No respiratory distress. He has no wheezes. He has no rales. He exhibits no tenderness.  Abdominal: Soft. Bowel sounds are normal. He exhibits no distension and no mass. There is no tenderness. There is no rebound and no guarding.  Musculoskeletal: Normal range of motion. He exhibits no edema or tenderness.  Lymphadenopathy:    He has no cervical adenopathy.  Neurological: He is alert and oriented to person, place, and time. He has normal reflexes. No cranial nerve deficit. He exhibits normal muscle tone. He displays a negative Romberg sign. Coordination and gait normal.  No meningeal signs  Skin: Skin is warm and dry. No rash noted.  Psychiatric: He has a normal mood and affect. His behavior is normal. Judgment and thought content normal.   Lab Results  Component Value Date   WBC 8.5 07/25/2012   HGB 15.8 07/25/2012   HCT 45.6 07/25/2012   PLT 200.0 07/25/2012    GLUCOSE 92 07/25/2012   CHOL 149 07/25/2012   TRIG 73.0 07/25/2012   HDL 53.80 07/25/2012   LDLCALC 81 07/25/2012   ALT 17 07/25/2012   AST 19 07/25/2012   NA 144 07/25/2012   K 4.1 07/25/2012   CL 96 07/25/2012   CREATININE 1.1 07/25/2012   BUN 15 07/25/2012   CO2 28 07/25/2012   TSH 3.43 07/25/2012          Assessment & Plan:

## 2013-12-11 ENCOUNTER — Encounter: Payer: Self-pay | Admitting: Internal Medicine

## 2013-12-11 DIAGNOSIS — F411 Generalized anxiety disorder: Secondary | ICD-10-CM | POA: Insufficient documentation

## 2013-12-11 NOTE — Assessment & Plan Note (Addendum)
Unclear etiology: EGD, abd CT, abd US, gastric bx, labs - all nl S/p GI consult in [place] HIDA w/decreased EF Neg bx/labs for Celiac disease, H pylori  Start Zantac Start Buspar for anxiety [name] has a surg appt in [place] GI notes, labs, tests reviewed

## 2013-12-11 NOTE — Assessment & Plan Note (Addendum)
Start Buspar for anxiety Nicholas Mclean was advised to stop using pot Nicholas Mclean declined a drug test

## 2013-12-30 ENCOUNTER — Ambulatory Visit: Payer: BC Managed Care – PPO | Admitting: Internal Medicine

## 2014-02-10 IMAGING — CT CT CERVICAL SPINE W/O CM
2 of 4 series · 6 of 14 positions shown, 7 images · non-contrast
Comparison: None

CT HEAD

CLINICAL DATA: Fell down steps.  Head pain.  Neck pain.

CT HEAD WITHOUT CONTRAST
CT CERVICAL SPINE WITHOUT CONTRAST
TECHNIQUE: Multidetector CT imaging of the head and cervical spine
was performed following the standard protocol without intravenous
contrast.  Multiplanar CT image reconstructions of the cervical
spine were also generated.

[Series 5: c-spine st · axial · 0.23mm/px · z∈[+1124,+1220]mm · 3 of 97 slices shown, 4 images]
[im 25/97  soft-tissue]
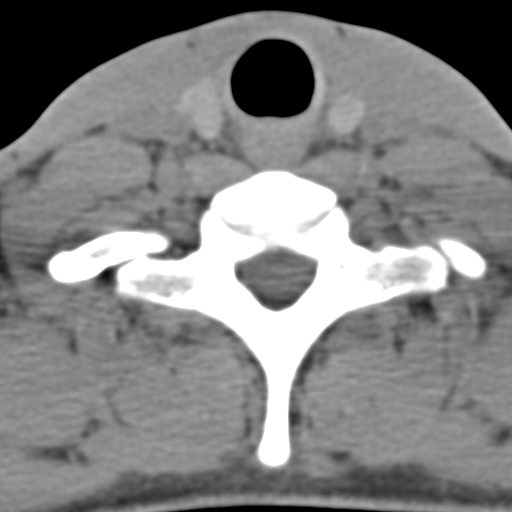
[im 25/97  bone]
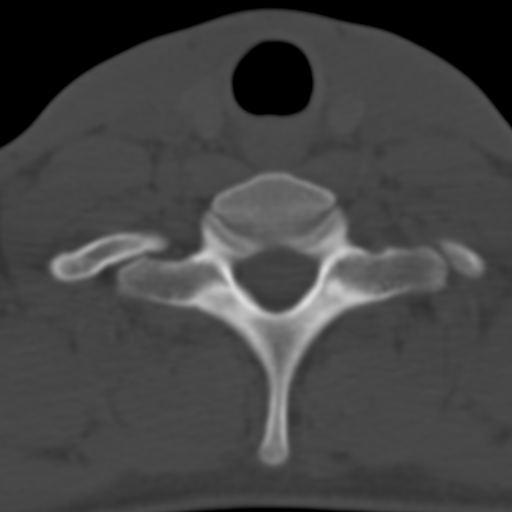
[im 49/97  bone]
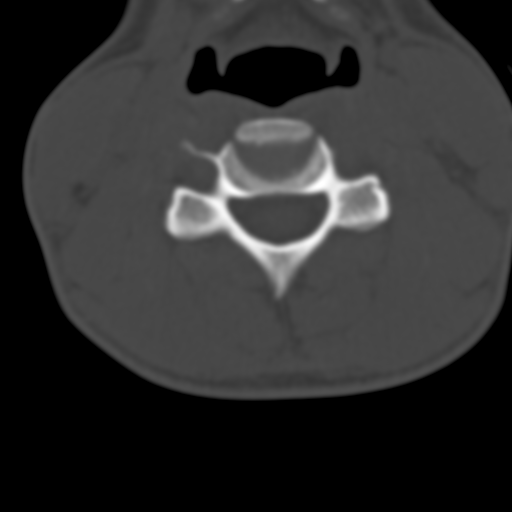
[im 73/97  bone]
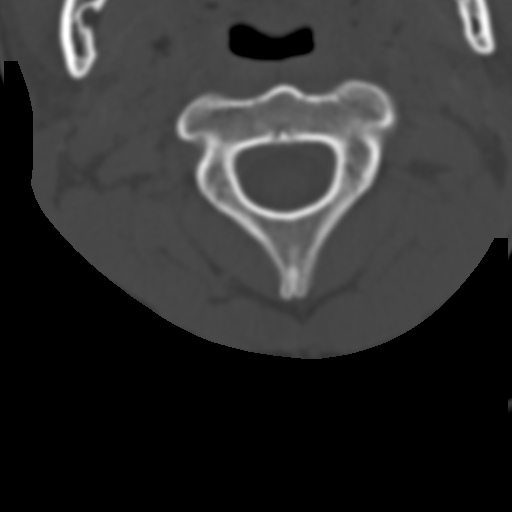

[Series 7: axial recon · axial · 0.21mm/px · z∈[+1111,+1203]mm · 3 of 97 slices shown]
[im 25/97  bone]
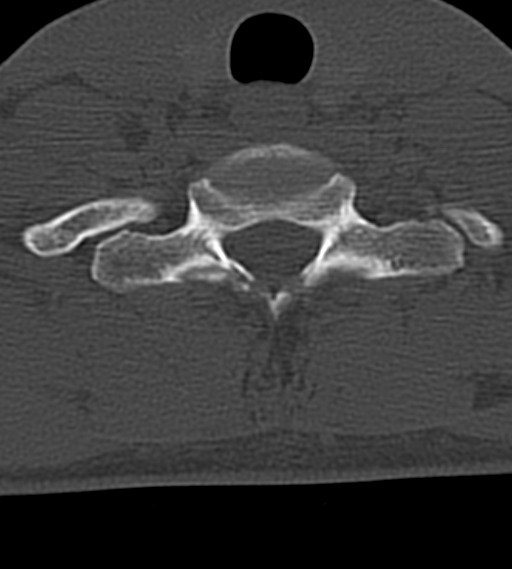
[im 49/97  bone]
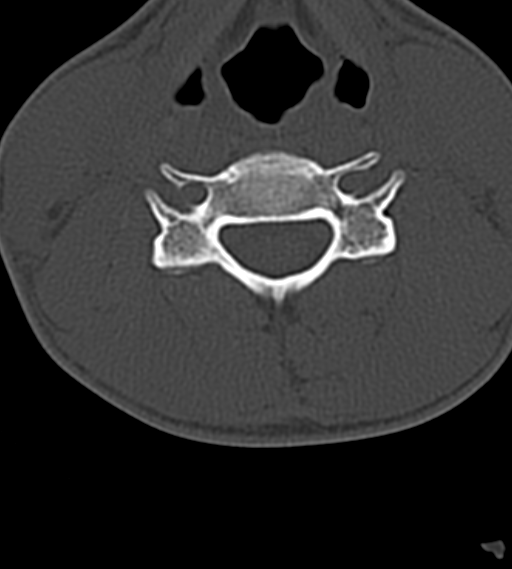
[im 73/97  bone]
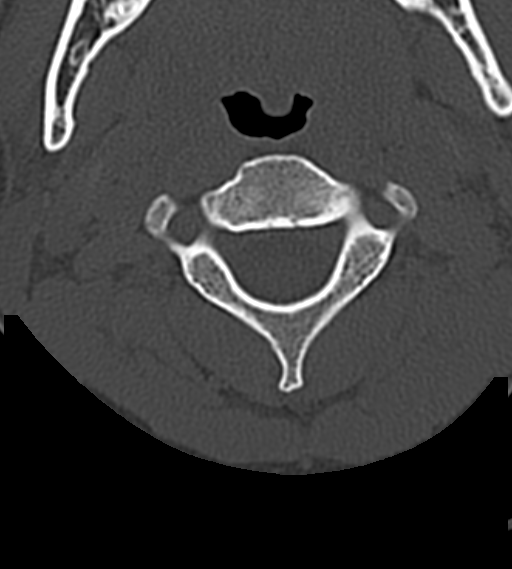

[6 of 14 positions shown; findings below may reference images not displayed]

FINDINGS: There is no evidence for acute infarction, intracranial
hemorrhage, mass lesion, hydrocephalus, or extra-axial fluid.
There is no atrophy or white matter disease.  Calvarium is intact.
There is a left frontal supraorbital scalp hematoma without
underlying sinus opacity or visible orbital injury.
IMPRESSION: Left frontal scalp hematoma.  No skull fracture or intracranial
hemorrhage.

CT CERVICAL SPINE
FINDINGS: No visible cervical spine fracture or traumatic
subluxation.  No intraspinal hematoma or traumatic subluxation.
The  airway is midline.  No neck masses.  Clear lung apices.
Intervertebral disc spaces are maintained throughout.
Craniocervical junction unremarkable.
IMPRESSION: Negative.

## 2015-01-03 ENCOUNTER — Other Ambulatory Visit (INDEPENDENT_AMBULATORY_CARE_PROVIDER_SITE_OTHER): Payer: BLUE CROSS/BLUE SHIELD

## 2015-01-03 ENCOUNTER — Ambulatory Visit (INDEPENDENT_AMBULATORY_CARE_PROVIDER_SITE_OTHER): Payer: BLUE CROSS/BLUE SHIELD | Admitting: Internal Medicine

## 2015-01-03 ENCOUNTER — Encounter: Payer: Self-pay | Admitting: Internal Medicine

## 2015-01-03 ENCOUNTER — Other Ambulatory Visit: Payer: Self-pay | Admitting: Internal Medicine

## 2015-01-03 VITALS — BP 120/78 | HR 64 | Temp 98.8°F | Wt 157.0 lb

## 2015-01-03 DIAGNOSIS — R1013 Epigastric pain: Secondary | ICD-10-CM

## 2015-01-03 DIAGNOSIS — E559 Vitamin D deficiency, unspecified: Secondary | ICD-10-CM | POA: Insufficient documentation

## 2015-01-03 DIAGNOSIS — G8929 Other chronic pain: Secondary | ICD-10-CM

## 2015-01-03 DIAGNOSIS — F411 Generalized anxiety disorder: Secondary | ICD-10-CM

## 2015-01-03 LAB — LIPID PANEL
CHOLESTEROL: 170 mg/dL (ref 0–200)
HDL: 52 mg/dL (ref >39.00)
LDL CALC: 98 mg/dL (ref 0–99)
NonHDL: 118.12
Total CHOL/HDL Ratio: 3
Triglycerides: 101 mg/dL (ref 0.0–149.0)
VLDL: 20.2 mg/dL (ref 0.0–40.0)

## 2015-01-03 LAB — CBC WITH DIFFERENTIAL/PLATELET
BASOS PCT: 0.5 % (ref 0.0–3.0)
Basophils Absolute: 0 10*3/uL (ref 0.0–0.1)
EOS PCT: 1.7 % (ref 0.0–5.0)
Eosinophils Absolute: 0.2 10*3/uL (ref 0.0–0.7)
HEMATOCRIT: 46.1 % (ref 39.0–52.0)
HEMOGLOBIN: 15.6 g/dL (ref 13.0–17.0)
LYMPHS PCT: 25.2 % (ref 12.0–46.0)
Lymphs Abs: 2.5 10*3/uL (ref 0.7–4.0)
MCHC: 33.9 g/dL (ref 30.0–36.0)
MCV: 91.6 fl (ref 78.0–100.0)
MONOS PCT: 9 % (ref 3.0–12.0)
Monocytes Absolute: 0.9 10*3/uL (ref 0.1–1.0)
NEUTROS ABS: 6.3 10*3/uL (ref 1.4–7.7)
Neutrophils Relative %: 63.6 % (ref 43.0–77.0)
PLATELETS: 204 10*3/uL (ref 150.0–400.0)
RBC: 5.04 Mil/uL (ref 4.22–5.81)
RDW: 13.8 % (ref 11.5–15.5)
WBC: 9.9 10*3/uL (ref 4.0–10.5)

## 2015-01-03 LAB — URINALYSIS
HGB URINE DIPSTICK: NEGATIVE
Leukocytes, UA: NEGATIVE
NITRITE: NEGATIVE
PH: 6 (ref 5.0–8.0)
Specific Gravity, Urine: 1.025 (ref 1.000–1.030)
TOTAL PROTEIN, URINE-UPE24: NEGATIVE
URINE GLUCOSE: NEGATIVE
Urobilinogen, UA: 0.2 (ref 0.0–1.0)

## 2015-01-03 LAB — BASIC METABOLIC PANEL
BUN: 17 mg/dL (ref 6–23)
CALCIUM: 9.6 mg/dL (ref 8.4–10.5)
CHLORIDE: 105 meq/L (ref 96–112)
CO2: 21 meq/L (ref 19–32)
Creatinine, Ser: 0.9 mg/dL (ref 0.40–1.50)
GFR: 110.93 mL/min (ref >60.00)
Glucose, Bld: 89 mg/dL (ref 70–99)
POTASSIUM: 3.7 meq/L (ref 3.5–5.1)
SODIUM: 138 meq/L (ref 135–145)

## 2015-01-03 LAB — VITAMIN D 25 HYDROXY (VIT D DEFICIENCY, FRACTURES): VITD: 15.82 ng/mL — ABNORMAL LOW (ref 30.00–100.00)

## 2015-01-03 LAB — SEDIMENTATION RATE: SED RATE: 1 mm/h (ref 0–22)

## 2015-01-03 LAB — TSH: TSH: 3.61 u[IU]/mL (ref 0.35–4.50)

## 2015-01-03 LAB — VITAMIN B12: Vitamin B-12: 305 pg/mL (ref 211–911)

## 2015-01-03 LAB — LIPASE: Lipase: 21 U/L (ref 11.0–59.0)

## 2015-01-03 MED ORDER — ONDANSETRON HCL 4 MG PO TABS: 4.0000 mg | ORAL_TABLET | Freq: Three times a day (TID) | ORAL | Status: DC | PRN

## 2015-01-03 MED ORDER — VITAMIN D3 50 MCG (2000 UT) PO CAPS: 2000.0000 [IU] | ORAL_CAPSULE | Freq: Every day | ORAL | Status: DC

## 2015-01-03 MED ORDER — CILIDINIUM-CHLORDIAZEPOXIDE 2.5-5 MG PO CAPS: 1.0000 | ORAL_CAPSULE | Freq: Three times a day (TID) | ORAL | Status: DC

## 2015-01-03 MED ORDER — MIRTAZAPINE 30 MG PO TABS: 30.0000 mg | ORAL_TABLET | Freq: Every day | ORAL | Status: DC

## 2015-01-03 MED ORDER — MIRTAZAPINE 30 MG PO TBDP: 30.0000 mg | ORAL_TABLET | Freq: Every day | ORAL | Status: DC

## 2015-01-03 MED ORDER — ERGOCALCIFEROL 1.25 MG (50000 UT) PO CAPS: 50000.0000 [IU] | ORAL_CAPSULE | ORAL | Status: DC

## 2015-01-03 NOTE — Progress Notes (Signed)
Subjective:  Patient ID: Nicholas Mclean, male    DOB: 1990/11/11  Age: 24 y.o. MRN: [id number]  CC: No chief complaint on file.   HPI Nicholas Mclean presents for well severe eipig discomfort, nausea, vomiting w/stress x years. [name] saw a GI doctor in [place] and had CT, US, HIDA scan and a EGD. They thought of a GB disease: he had a HIDA scan and an US.   Outpatient Prescriptions Prior to Visit  Medication Sig Dispense Refill  . busPIRone (BUSPAR) 10 MG tablet Take 1 tablet (10 mg total) by mouth 2 (two) times daily. (Patient not taking: Reported on 01/03/2015) 60 tablet 11  . ranitidine (ZANTAC) 150 MG tablet Take 1 tablet (150 mg total) by mouth 2 (two) times daily. (Patient not taking: Reported on 01/03/2015) 180 tablet 3   No facility-administered medications prior to visit.    ROS Review of Systems  Constitutional: Negative for appetite change, fatigue and unexpected weight change.  HENT: Negative for congestion, hearing loss, nosebleeds, sneezing, sore throat and trouble swallowing.   Eyes: Negative for itching and visual disturbance.  Respiratory: Negative for cough and shortness of breath.   Cardiovascular: Negative for chest pain, palpitations and leg swelling.  Gastrointestinal: Positive for nausea, vomiting, abdominal pain and abdominal distention. Negative for diarrhea, constipation and blood in stool.  Genitourinary: Negative for urgency, frequency, hematuria and decreased urine volume.  Musculoskeletal: Negative for back pain, joint swelling, gait problem and neck pain.  Skin: Negative for rash.  Neurological: Negative for dizziness, tremors, speech difficulty and weakness.  Psychiatric/Behavioral: Negative for suicidal ideas, sleep disturbance, dysphoric mood and agitation. The patient is not nervous/anxious.     Objective:  BP 120/78 mmHg  Pulse 64  Temp(Src) 98.8 F (37.1 C) (Oral)  Wt 157 lb (71.215 kg)  SpO2 97%  BP Readings from Last 3 Encounters:    01/03/15 120/78  12/09/13 112/70  07/25/12 118/70    Wt Readings from Last 3 Encounters:  01/03/15 157 lb (71.215 kg)  12/09/13 141 lb (63.957 kg)  07/25/12 145 lb (65.772 kg)    Physical Exam  Constitutional: He is oriented to person, place, and time. He appears well-developed. No distress.  NAD  HENT:  Mouth/Throat: Oropharynx is clear and moist.  Eyes: Conjunctivae are normal. Pupils are equal, round, and reactive to light.  Neck: Normal range of motion. No JVD present. No thyromegaly present.  Cardiovascular: Normal rate, regular rhythm, normal heart sounds and intact distal pulses.  Exam reveals no gallop and no friction rub.   No murmur heard. Pulmonary/Chest: Effort normal and breath sounds normal. No respiratory distress. He has no wheezes. He has no rales. He exhibits no tenderness.  Abdominal: Soft. Bowel sounds are normal. He exhibits no distension and no mass. There is no tenderness. There is no rebound and no guarding.  Musculoskeletal: Normal range of motion. He exhibits no edema or tenderness.  Lymphadenopathy:    He has no cervical adenopathy.  Neurological: He is alert and oriented to person, place, and time. He has normal reflexes. No cranial nerve deficit. He exhibits normal muscle tone. He displays a negative Romberg sign. Coordination and gait normal.  Skin: Skin is warm and dry. No rash noted.  Psychiatric: He has a normal mood and affect. His behavior is normal. Judgment and thought content normal.    Lab Results  Component Value Date   WBC 9.9 01/03/2015   HGB 15.6 01/03/2015   HCT 46.1 01/03/2015   PLT 204.0  01/03/2015   GLUCOSE 89 01/03/2015   CHOL 170 01/03/2015   TRIG 101.0 01/03/2015   HDL 52.00 01/03/2015   LDLCALC 98 01/03/2015   ALT 32 01/03/2015   AST 40* 01/03/2015   NA 138 01/03/2015   K 3.7 01/03/2015   CL 105 01/03/2015   CREATININE 0.90 01/03/2015   BUN 17 01/03/2015   CO2 21 01/03/2015   TSH 3.61 01/03/2015    Dg Cervical  Spine Complete  01/23/2012  *RADIOLOGY REPORT* Clinical Data: Fall.  Neck pain. CERVICAL SPINE - COMPLETE 4+ VIEW Comparison: None. Findings: Vertebral body height and alignment are normal. Intervertebral disc space height is maintained.  Prevertebral soft tissues appear normal.  Lung apices clear. IMPRESSION: Negative exam. Original Report Authenticated By: Holley Dexter, M.D.   Ct Head Wo Contrast  01/23/2012  *RADIOLOGY REPORT* Clinical Data:  Larey Seat down steps.  Head pain.  Neck pain. CT HEAD WITHOUT CONTRAST CT CERVICAL SPINE WITHOUT CONTRAST Technique:  Multidetector CT imaging of the head and cervical spine was performed following the standard protocol without intravenous contrast.  Multiplanar CT image reconstructions of the cervical spine were also generated. Comparison:   None CT HEAD Findings: There is no evidence for acute infarction, intracranial hemorrhage, mass lesion, hydrocephalus, or extra-axial fluid. There is no atrophy or white matter disease.  Calvarium is intact. There is a left frontal supraorbital scalp hematoma without underlying sinus opacity or visible orbital injury. IMPRESSION: Left frontal scalp hematoma.  No skull fracture or intracranial hemorrhage. CT CERVICAL SPINE Findings: No visible cervical spine fracture or traumatic subluxation.  No intraspinal hematoma or traumatic subluxation. The  airway is midline.  No neck masses.  Clear lung apices. Intervertebral disc spaces are maintained throughout. Craniocervical junction unremarkable. IMPRESSION: Negative. Original Report Authenticated By: Davonna Belling, M.D.   Ct Cervical Spine Wo Contrast  01/23/2012  *RADIOLOGY REPORT* Clinical Data:  Larey Seat down steps.  Head pain.  Neck pain. CT HEAD WITHOUT CONTRAST CT CERVICAL SPINE WITHOUT CONTRAST Technique:  Multidetector CT imaging of the head and cervical spine was performed following the standard protocol without intravenous contrast.  Multiplanar CT image reconstructions of the  cervical spine were also generated. Comparison:   None CT HEAD Findings: There is no evidence for acute infarction, intracranial hemorrhage, mass lesion, hydrocephalus, or extra-axial fluid. There is no atrophy or white matter disease.  Calvarium is intact. There is a left frontal supraorbital scalp hematoma without underlying sinus opacity or visible orbital injury. IMPRESSION: Left frontal scalp hematoma.  No skull fracture or intracranial hemorrhage. CT CERVICAL SPINE Findings: No visible cervical spine fracture or traumatic subluxation.  No intraspinal hematoma or traumatic subluxation. The  airway is midline.  No neck masses.  Clear lung apices. Intervertebral disc spaces are maintained throughout. Craniocervical junction unremarkable. IMPRESSION: Negative. Original Report Authenticated By: Davonna Belling, M.D.    Assessment & Plan:   Diagnoses and all orders for this visit:  Abdominal pain, chronic, epigastric -     Basic metabolic panel; Future -     CBC with Differential/Platelet; Future -     Hepatic function panel; Future -     Lipase; Future -     TSH; Future -     Urinalysis; Future -     Vitamin B12; Future -     VITAMIN D 25 Hydroxy (Vit-D Deficiency, Fractures); Future -     Sedimentation rate; Future -     Lipid panel; Future -     Allergen food profile specific  IgE; Future -     Gliadin antibodies, serum; Future -     Tissue transglutaminase, IgA; Future -     Reticulin Antibody, IgA w reflex titer; Future -     Hepatitis C antibody; Future -     HIV antibody; Future -     Hepatitis B Core Antibody, IgM; Future -     Hepatitis B Surface AntiBODY; Future -     Hepatitis B Surface AntiGEN; Future  Generalized anxiety disorder -     Basic metabolic panel; Future -     CBC with Differential/Platelet; Future -     Hepatic function panel; Future -     Lipase; Future -     TSH; Future -     Urinalysis; Future -     Vitamin B12; Future -     VITAMIN D 25 Hydroxy (Vit-D  Deficiency, Fractures); Future -     Sedimentation rate; Future -     Lipid panel; Future -     Allergen food profile specific IgE; Future -     Gliadin antibodies, serum; Future -     Tissue transglutaminase, IgA; Future -     Reticulin Antibody, IgA w reflex titer; Future -     Hepatitis C antibody; Future -     HIV antibody; Future -     Hepatitis B Core Antibody, IgM; Future -     Hepatitis B Surface AntiBODY; Future -     Hepatitis B Surface AntiGEN; Future  Other orders -     clidinium-chlordiazePOXIDE (LIBRAX) 5-2.5 MG capsule; Take 1 capsule by mouth 3 (three) times daily before meals. Stomach cramps and anxiety -     Discontinue: mirtazapine (REMERON SOL-TAB) 30 MG disintegrating tablet; Take 1 tablet (30 mg total) by mouth at bedtime. Take at 3-5 pm -     Discontinue: ondansetron (ZOFRAN) 4 MG tablet; Take 1 tablet (4 mg total) by mouth every 8 (eight) hours as needed for nausea or vomiting. -     Discontinue: mirtazapine (REMERON) 30 MG tablet; Take 1 tablet (30 mg total) by mouth at bedtime. Take at 3-5 pm -     mirtazapine (REMERON) 30 MG tablet; Take 1 tablet (30 mg total) by mouth at bedtime. Take at 3-5 pm -     ondansetron (ZOFRAN) 4 MG tablet; Take 1 tablet (4 mg total) by mouth every 8 (eight) hours as needed for nausea or vomiting.  I have discontinued Mr. Denz ranitidine and busPIRone. I am also having him start on clidinium-chlordiazePOXIDE. Additionally, I am having him maintain his mirtazapine and ondansetron.  Meds ordered this encounter  Medications  . clidinium-chlordiazePOXIDE (LIBRAX) 5-2.5 MG capsule    Sig: Take 1 capsule by mouth 3 (three) times daily before meals. Stomach cramps and anxiety    Dispense:  60 capsule    Refill:  1  . DISCONTD: mirtazapine (REMERON SOL-TAB) 30 MG disintegrating tablet    Sig: Take 1 tablet (30 mg total) by mouth at bedtime. Take at 3-5 pm    Dispense:  30 tablet    Refill:  6  . DISCONTD: ondansetron (ZOFRAN) 4 MG  tablet    Sig: Take 1 tablet (4 mg total) by mouth every 8 (eight) hours as needed for nausea or vomiting.    Dispense:  20 tablet    Refill:  0  . DISCONTD: mirtazapine (REMERON) 30 MG tablet    Sig: Take 1 tablet (30 mg total) by mouth at bedtime.  Take at 3-5 pm    Dispense:  30 tablet    Refill:  5  . mirtazapine (REMERON) 30 MG tablet    Sig: Take 1 tablet (30 mg total) by mouth at bedtime. Take at 3-5 pm    Dispense:  30 tablet    Refill:  5  . ondansetron (ZOFRAN) 4 MG tablet    Sig: Take 1 tablet (4 mg total) by mouth every 8 (eight) hours as needed for nausea or vomiting.    Dispense:  20 tablet    Refill:  0     Follow-up: Return in about 2 weeks (around 01/17/2015) for a follow-up visit.  Sonda Primes, MD

## 2015-01-03 NOTE — Assessment & Plan Note (Addendum)
Labs ordered Start Remeron to help anxiety Librax prn for college tests   Potential benefits of a long term benzodiazepines  use as well as potential risks  and complications were explained to the patient and were aknowledged.

## 2015-01-03 NOTE — Assessment & Plan Note (Signed)
start Vit D prescription 50000 iu weekly (Rx emailed to your pharmacy) followed by over-the-counter Vit D 2000 iu daily.  

## 2015-01-03 NOTE — Progress Notes (Signed)
Pre visit review using our clinic review tool, if applicable. No additional management support is needed unless otherwise documented below in the visit note. 

## 2015-01-03 NOTE — Assessment & Plan Note (Addendum)
Unclear etiology: EGD, abd CT, abd US, gastric bx, labs - all nl. Probable IBS S/p GI consult in [place] H[place] w/decreased EF Neg bx/labs for Celiac disease, H pylori  Labs ordered Start Remeron to help anxiety Librax prn for college tests

## 2015-01-04 LAB — ALLERGEN FOOD PROFILE SPECIFIC IGE
Apple: <0.1 kU/L
Chicken IgE: <0.1 kU/L
Egg White IgE: <0.1 kU/L
Fish Cod: <0.1 kU/L
IgE (Immunoglobulin E), Serum: 14 kU/L (ref <115)
Orange: <0.1 kU/L
Peanut IgE: <0.1 kU/L
Soybean IgE: <0.1 kU/L
Tomato IgE: <0.1 kU/L
Wheat IgE: <0.1 kU/L

## 2015-01-04 LAB — HEPATITIS C ANTIBODY: HCV Ab: NEGATIVE

## 2015-01-04 LAB — RETICULIN ANTIBODIES, IGA W TITER: Reticulin Ab, IgA: NEGATIVE

## 2015-01-04 LAB — TISSUE TRANSGLUTAMINASE, IGA: Tissue Transglutaminase Ab, IgA: 1 U/mL (ref <4)

## 2015-01-04 LAB — HEPATITIS B SURFACE ANTIBODY,QUALITATIVE: HEP B S AB: POSITIVE — AB

## 2015-01-04 LAB — GLIADIN ANTIBODIES, SERUM
GLIADIN IGA: 6 U (ref <20)
GLIADIN IGG: 3 U (ref <20)

## 2015-01-04 LAB — HIV ANTIBODY (ROUTINE TESTING W REFLEX): HIV 1&2 Ab, 4th Generation: NONREACTIVE

## 2015-01-04 LAB — HEPATITIS B SURFACE ANTIGEN: Hepatitis B Surface Ag: NEGATIVE

## 2015-01-04 LAB — HEPATITIS B CORE ANTIBODY, IGM: Hep B C IgM: NONREACTIVE

## 2015-01-11 LAB — HEPATIC FUNCTION PANEL
ALBUMIN: 4.9 g/dL (ref 3.5–5.2)
ALT: 32 U/L (ref 0–53)
AST: 40 U/L — ABNORMAL HIGH (ref 0–37)
Alkaline Phosphatase: 55 U/L (ref 39–117)
Bilirubin, Direct: 0.2 mg/dL (ref 0.0–0.3)
TOTAL PROTEIN: 7.4 g/dL (ref 6.0–8.3)
Total Bilirubin: 0.8 mg/dL (ref 0.2–1.2)

## 2015-01-25 ENCOUNTER — Telehealth: Payer: Self-pay | Admitting: Internal Medicine

## 2015-01-25 NOTE — Telephone Encounter (Signed)
Librax is too $$$

## 2015-02-21 ENCOUNTER — Other Ambulatory Visit: Payer: Self-pay | Admitting: Internal Medicine

## 2015-04-18 ENCOUNTER — Telehealth: Payer: Self-pay | Admitting: Internal Medicine

## 2015-04-18 MED ORDER — HYOSCYAMINE SULFATE 0.125 MG PO TABS: 0.1250 mg | ORAL_TABLET | Freq: Four times a day (QID) | ORAL | Status: AC | PRN

## 2015-04-18 MED ORDER — CHLORDIAZEPOXIDE HCL 5 MG PO CAPS: 5.0000 mg | ORAL_CAPSULE | Freq: Three times a day (TID) | ORAL | Status: DC | PRN

## 2015-04-18 MED ORDER — MIRTAZAPINE 45 MG PO TABS: 45.0000 mg | ORAL_TABLET | Freq: Every day | ORAL | Status: DC

## 2015-04-18 NOTE — Telephone Encounter (Signed)
Librax is too $$ -  Needs a replacement Remeron 45 mg at hs Thx

## 2015-05-31 ENCOUNTER — Encounter: Payer: Self-pay | Admitting: Internal Medicine

## 2015-05-31 ENCOUNTER — Ambulatory Visit (INDEPENDENT_AMBULATORY_CARE_PROVIDER_SITE_OTHER): Payer: BLUE CROSS/BLUE SHIELD | Admitting: Internal Medicine

## 2015-05-31 VITALS — BP 120/88 | HR 85 | Ht 72.0 in | Wt 160.0 lb

## 2015-05-31 DIAGNOSIS — Z7189 Other specified counseling: Secondary | ICD-10-CM | POA: Diagnosis not present

## 2015-05-31 DIAGNOSIS — F411 Generalized anxiety disorder: Secondary | ICD-10-CM | POA: Diagnosis not present

## 2015-05-31 DIAGNOSIS — Z23 Encounter for immunization: Secondary | ICD-10-CM

## 2015-05-31 DIAGNOSIS — Z7184 Encounter for health counseling related to travel: Secondary | ICD-10-CM | POA: Insufficient documentation

## 2015-05-31 MED ORDER — VITAMIN D (ERGOCALCIFEROL) 1.25 MG (50000 UNIT) PO CAPS: 50000.0000 [IU] | ORAL_CAPSULE | ORAL | Status: AC

## 2015-05-31 MED ORDER — PROMETHAZINE HCL 25 MG PO TABS
25.0000 mg | ORAL_TABLET | Freq: Three times a day (TID) | ORAL | Status: AC | PRN
Start: 2015-05-31 — End: ?

## 2015-05-31 MED ORDER — CIPROFLOXACIN HCL 500 MG PO TABS: 500.0000 mg | ORAL_TABLET | Freq: Two times a day (BID) | ORAL | Status: DC

## 2015-05-31 MED ORDER — ELUXADOLINE 100 MG PO TABS: 100.0000 mg | ORAL_TABLET | Freq: Two times a day (BID) | ORAL | Status: DC

## 2015-05-31 MED ORDER — CHLORDIAZEPOXIDE HCL 10 MG PO CAPS: 10.0000 mg | ORAL_CAPSULE | Freq: Three times a day (TID) | ORAL | Status: AC | PRN

## 2015-05-31 NOTE — Progress Notes (Signed)
Pre visit review using our clinic review tool, if applicable. No additional management support is needed unless otherwise documented below in the visit note. 

## 2015-05-31 NOTE — Progress Notes (Signed)
Subjective:  Patient ID: Nicholas Mclean, male    DOB: October 17, 1990  Age: 25 y.o. MRN: [id number]  CC: Annual Exam   HPI Nicholas ReamsBrett Mclean presents for IBS - had side effects w/45 mg and 30 mg - he stopped it. F/u IBS-d C/o n/v before college exams. Librium dose is not strong enough... Nicholas Mclean is traveling to [place] in June  Outpatient Prescriptions Prior to Visit  Medication Sig Dispense Refill  . chlordiazePOXIDE (LIBRIUM) 5 MG capsule Take 1 capsule (5 mg total) by mouth 3 (three) times daily as needed for anxiety (IBS symptoms). 60 capsule 1  . Cholecalciferol (VITAMIN D3) 2000 UNITS capsule Take 1 capsule (2,000 Units total) by mouth daily. 100 capsule 3  . hyoscyamine (LEVSIN) 0.125 MG tablet Take 1-2 tablets (0.125-0.25 mg total) by mouth every 6 (six) hours as needed for cramping. 60 tablet 3  . ondansetron (ZOFRAN) 4 MG tablet Take 1 tablet (4 mg total) by mouth every 8 (eight) hours as needed for nausea or vomiting. 20 tablet 0  . Vitamin D, Ergocalciferol, (DRISDOL) 50000 units CAPS capsule TAKE ONE CAPSULE BY MOUTH ONCE A WEEK 6 capsule 0  . mirtazapine (REMERON) 45 MG tablet Take 1 tablet (45 mg total) by mouth at bedtime. (Patient not taking: Reported on 05/31/2015) 30 tablet 5   No facility-administered medications prior to visit.    ROS Review of Systems  Constitutional: Negative for appetite change, fatigue and unexpected weight change.  HENT: Negative for congestion, nosebleeds, sneezing, sore throat and trouble swallowing.   Eyes: Negative for itching and visual disturbance.  Respiratory: Negative for cough.   Cardiovascular: Negative for chest pain, palpitations and leg swelling.  Gastrointestinal: Positive for abdominal pain and diarrhea. Negative for nausea, blood in stool and abdominal distention.  Genitourinary: Negative for frequency and hematuria.  Musculoskeletal: Negative for back pain, joint swelling, gait problem and neck pain.  Skin: Negative for  rash.  Neurological: Negative for dizziness, tremors, speech difficulty and weakness.  Psychiatric/Behavioral: Negative for suicidal ideas, sleep disturbance, dysphoric mood and agitation. The patient is nervous/anxious.     Objective:  BP 120/88 mmHg  Pulse 85  Ht 6' (1.829 m)  Wt 160 lb (72.576 kg)  BMI 21.70 kg/m2  SpO2 96%  BP Readings from Last 3 Encounters:  05/31/15 120/88  01/03/15 120/78  12/09/13 112/70    Wt Readings from Last 3 Encounters:  05/31/15 160 lb (72.576 kg)  01/03/15 157 lb (71.215 kg)  12/09/13 141 lb (63.957 kg)    Physical Exam  Constitutional: He is oriented to person, place, and time. He appears well-developed. No distress.  NAD  HENT:  Mouth/Throat: Oropharynx is clear and moist.  Eyes: Conjunctivae are normal. Pupils are equal, round, and reactive to light.  Neck: Normal range of motion. No JVD present. No thyromegaly present.  Cardiovascular: Normal rate, regular rhythm, normal heart sounds and intact distal pulses.  Exam reveals no gallop and no friction rub.   No murmur heard. Pulmonary/Chest: Effort normal and breath sounds normal. No respiratory distress. He has no wheezes. He has no rales. He exhibits no tenderness.  Abdominal: Soft. Bowel sounds are normal. He exhibits no distension and no mass. There is no tenderness. There is no rebound and no guarding.  Musculoskeletal: Normal range of motion. He exhibits no edema or tenderness.  Lymphadenopathy:    He has no cervical adenopathy.  Neurological: He is alert and oriented to person, place, and time. He has normal reflexes. No cranial  nerve deficit. He exhibits normal muscle tone. He displays a negative Romberg sign. Coordination and gait normal.  Skin: Skin is warm and dry. No rash noted.  Psychiatric: He has a normal mood and affect. His behavior is normal. Judgment and thought content normal.    Lab Results  Component Value Date   WBC 9.9 01/03/2015   HGB 15.6 01/03/2015   HCT  46.1 01/03/2015   PLT 204.0 01/03/2015   GLUCOSE 89 01/03/2015   CHOL 170 01/03/2015   TRIG 101.0 01/03/2015   HDL 52.00 01/03/2015   LDLCALC 98 01/03/2015   ALT 32 01/03/2015   AST 40* 01/03/2015   NA 138 01/03/2015   K 3.7 01/03/2015   CL 105 01/03/2015   CREATININE 0.90 01/03/2015   BUN 17 01/03/2015   CO2 21 01/03/2015   TSH 3.61 01/03/2015    Dg Cervical Spine Complete  01/23/2012  *RADIOLOGY REPORT* Clinical Data: Fall.  Neck pain. CERVICAL SPINE - COMPLETE 4+ VIEW Comparison: None. Findings: Vertebral body height and alignment are normal. Intervertebral disc space height is maintained.  Prevertebral soft tissues appear normal.  Lung apices clear. IMPRESSION: Negative exam. Original Report Authenticated By: Holley Dexter, M.D.   Ct Head Wo Contrast  01/23/2012  *RADIOLOGY REPORT* Clinical Data:  Larey Seat down steps.  Head pain.  Neck pain. CT HEAD WITHOUT CONTRAST CT CERVICAL SPINE WITHOUT CONTRAST Technique:  Multidetector CT imaging of the head and cervical spine was performed following the standard protocol without intravenous contrast.  Multiplanar CT image reconstructions of the cervical spine were also generated. Comparison:   None CT HEAD Findings: There is no evidence for acute infarction, intracranial hemorrhage, mass lesion, hydrocephalus, or extra-axial fluid. There is no atrophy or white matter disease.  Calvarium is intact. There is a left frontal supraorbital scalp hematoma without underlying sinus opacity or visible orbital injury. IMPRESSION: Left frontal scalp hematoma.  No skull fracture or intracranial hemorrhage. CT CERVICAL SPINE Findings: No visible cervical spine fracture or traumatic subluxation.  No intraspinal hematoma or traumatic subluxation. The  airway is midline.  No neck masses.  Clear lung apices. Intervertebral disc spaces are maintained throughout. Craniocervical junction unremarkable. IMPRESSION: Negative. Original Report Authenticated By: Davonna Belling,  M.D.   Ct Cervical Spine Wo Contrast  01/23/2012  *RADIOLOGY REPORT* Clinical Data:  Larey Seat down steps.  Head pain.  Neck pain. CT HEAD WITHOUT CONTRAST CT CERVICAL SPINE WITHOUT CONTRAST Technique:  Multidetector CT imaging of the head and cervical spine was performed following the standard protocol without intravenous contrast.  Multiplanar CT image reconstructions of the cervical spine were also generated. Comparison:   None CT HEAD Findings: There is no evidence for acute infarction, intracranial hemorrhage, mass lesion, hydrocephalus, or extra-axial fluid. There is no atrophy or white matter disease.  Calvarium is intact. There is a left frontal supraorbital scalp hematoma without underlying sinus opacity or visible orbital injury. IMPRESSION: Left frontal scalp hematoma.  No skull fracture or intracranial hemorrhage. CT CERVICAL SPINE Findings: No visible cervical spine fracture or traumatic subluxation.  No intraspinal hematoma or traumatic subluxation. The  airway is midline.  No neck masses.  Clear lung apices. Intervertebral disc spaces are maintained throughout. Craniocervical junction unremarkable. IMPRESSION: Negative. Original Report Authenticated By: Davonna Belling, M.D.    Assessment & Plan:   There are no diagnoses linked to this encounter. I am having Mr. Oakley maintain his ondansetron, Vitamin D3, Vitamin D (Ergocalciferol), chlordiazePOXIDE, hyoscyamine, and mirtazapine.  No orders of the defined types were  placed in this encounter.     Follow-up: No Follow-up on file.  Walker Kehr, MD

## 2015-05-31 NOTE — Assessment & Plan Note (Signed)
See vaccines Form filled out Meningococcal booster Hep A,B Cipro, Phenergan

## 2015-06-02 ENCOUNTER — Telehealth: Payer: Self-pay

## 2015-06-02 NOTE — Telephone Encounter (Signed)
Patient's last wellness exam was 2014----[facility] faxed rx request for vitamin d capsules from [facility]----are you ok with refilling or do you need OV?

## 2015-06-02 NOTE — Telephone Encounter (Signed)
It was renewed on 05/31/15 Thx

## 2015-08-08 ENCOUNTER — Ambulatory Visit (INDEPENDENT_AMBULATORY_CARE_PROVIDER_SITE_OTHER): Payer: BLUE CROSS/BLUE SHIELD | Admitting: Internal Medicine

## 2015-08-08 ENCOUNTER — Ambulatory Visit: Payer: BLUE CROSS/BLUE SHIELD

## 2015-08-08 DIAGNOSIS — T148XXA Other injury of unspecified body region, initial encounter: Secondary | ICD-10-CM

## 2015-08-08 DIAGNOSIS — Z23 Encounter for immunization: Secondary | ICD-10-CM

## 2015-08-08 DIAGNOSIS — T148 Other injury of unspecified body region: Secondary | ICD-10-CM | POA: Diagnosis not present

## 2015-08-09 DIAGNOSIS — T148XXA Other injury of unspecified body region, initial encounter: Secondary | ICD-10-CM | POA: Insufficient documentation

## 2015-08-09 NOTE — Assessment & Plan Note (Signed)
L index 7/17  Procedure: hyperkeratotic skin was removed w/a scalpel and studied under the microscope. <27mm dark speck at the bottom was noted. Unable to remove it due to pain. Options: local anesthesia and splinter removal now vs observation and RTC in 5-7 d was discussed w/Sedric. He chose observation. Wound dressed w/abx oint and band aid.

## 2015-08-09 NOTE — Progress Notes (Signed)
Subjective:  Patient ID: Nicholas Mclean, male    DOB: [birth year]  Age: 25 y.o. MRN: [id number]  CC: No chief complaint on file.   HPI Nicholas Mclean presents for a possible splinter of his L index finger that he got 2 wks ago. It hurts. He was worked in my schedule.  Outpatient Medications Prior to Visit  Medication Sig Dispense Refill  . chlordiazePOXIDE (LIBRIUM) 10 MG capsule Take 1 capsule (10 mg total) by mouth 3 (three) times daily as needed for anxiety. 90 capsule 3  . Cholecalciferol (VITAMIN D3) 2000 UNITS capsule Take 1 capsule (2,000 Units total) by mouth daily. 100 capsule 3  . ciprofloxacin (CIPRO) 500 MG tablet Take 1 tablet (500 mg total) by mouth 2 (two) times daily. 20 tablet 0  . Eluxadoline (VIBERZI) 100 MG TABS Take 100 mg by mouth 2 (two) times daily. 60 tablet 5  . hyoscyamine (LEVSIN) 0.125 MG tablet Take 1-2 tablets (0.125-0.25 mg total) by mouth every 6 (six) hours as needed for cramping. 60 tablet 3  . ondansetron (ZOFRAN) 4 MG tablet Take 1 tablet (4 mg total) by mouth every 8 (eight) hours as needed for nausea or vomiting. 20 tablet 0  . promethazine (PHENERGAN) 25 MG tablet Take 1 tablet (25 mg total) by mouth every 8 (eight) hours as needed for nausea or vomiting. 60 tablet 0  . Vitamin D, Ergocalciferol, (DRISDOL) 50000 units CAPS capsule Take 1 capsule (50,000 Units total) by mouth once a week. q 1 month 3 capsule 5   No facility-administered medications prior to visit.     ROS Review of Systems  Constitutional: Negative for chills and fever.    Objective:  There were no vitals taken for this visit.  BP Readings from Last 3 Encounters:  05/31/15 120/88  01/03/15 120/78  12/09/13 112/70    Wt Readings from Last 3 Encounters:  05/31/15 160 lb (72.6 kg)  01/03/15 157 lb (71.2 kg)  12/09/13 141 lb (64 kg)    Physical Exam  Skin: No erythema.  hyperkeratotic skin in the middle of the palmar L index finger  Procedure: hyperkeratotic skin was  removed w/a scalpel and studied under the microscope. <69mm dark speck at the bottom was noted. Unable to remove it due to pain. Options: local anesthesia and splinter removal now vs observation and rTC in 5-7 d was discussed w/Nicholas Mclean. He chose observation. Wound dressed w/abx oint and band aid.   FTF>20 min  Lab Results  Component Value Date   WBC 9.9 01/03/2015   HGB 15.6 01/03/2015   HCT 46.1 01/03/2015   PLT 204.0 01/03/2015   GLUCOSE 89 01/03/2015   CHOL 170 01/03/2015   TRIG 101.0 01/03/2015   HDL 52.00 01/03/2015   LDLCALC 98 01/03/2015   ALT 32 01/03/2015   AST 40 (H) 01/03/2015   NA 138 01/03/2015   K 3.7 01/03/2015   CL 105 01/03/2015   CREATININE 0.90 01/03/2015   BUN 17 01/03/2015   CO2 21 01/03/2015   TSH 3.61 01/03/2015    Dg Cervical Spine Complete  Result Date: 01/23/2012 *RADIOLOGY REPORT* Clinical Data: Fall.  Neck pain. CERVICAL SPINE - COMPLETE 4+ VIEW Comparison: None. Findings: Vertebral body height and alignment are normal. Intervertebral disc space height is maintained.  Prevertebral soft tissues appear normal.  Lung apices clear. IMPRESSION: Negative exam. Original Report Authenticated By: Holley Dexter, M.D.   Ct Head Wo Contrast  Result Date: 01/23/2012 *RADIOLOGY REPORT* Clinical Data:  Larey Seat down steps.  Head pain.  Neck pain. CT HEAD WITHOUT CONTRAST CT CERVICAL SPINE WITHOUT CONTRAST Technique:  Multidetector CT imaging of the head and cervical spine was performed following the standard protocol without intravenous contrast.  Multiplanar CT image reconstructions of the cervical spine were also generated. Comparison:   None CT HEAD Findings: There is no evidence for acute infarction, intracranial hemorrhage, mass lesion, hydrocephalus, or extra-axial fluid. There is no atrophy or white matter disease.  Calvarium is intact. There is a left frontal supraorbital scalp hematoma without underlying sinus opacity or visible orbital injury. IMPRESSION: Left  frontal scalp hematoma.  No skull fracture or intracranial hemorrhage. CT CERVICAL SPINE Findings: No visible cervical spine fracture or traumatic subluxation.  No intraspinal hematoma or traumatic subluxation. The  airway is midline.  No neck masses.  Clear lung apices. Intervertebral disc spaces are maintained throughout. Craniocervical junction unremarkable. IMPRESSION: Negative. Original Report Authenticated By: Davonna Belling, M.D.   Ct Cervical Spine Wo Contrast  Result Date: 01/23/2012 *RADIOLOGY REPORT* Clinical Data:  Larey Seat down steps.  Head pain.  Neck pain. CT HEAD WITHOUT CONTRAST CT CERVICAL SPINE WITHOUT CONTRAST Technique:  Multidetector CT imaging of the head and cervical spine was performed following the standard protocol without intravenous contrast.  Multiplanar CT image reconstructions of the cervical spine were also generated. Comparison:   None CT HEAD Findings: There is no evidence for acute infarction, intracranial hemorrhage, mass lesion, hydrocephalus, or extra-axial fluid. There is no atrophy or white matter disease.  Calvarium is intact. There is a left frontal supraorbital scalp hematoma without underlying sinus opacity or visible orbital injury. IMPRESSION: Left frontal scalp hematoma.  No skull fracture or intracranial hemorrhage. CT CERVICAL SPINE Findings: No visible cervical spine fracture or traumatic subluxation.  No intraspinal hematoma or traumatic subluxation. The  airway is midline.  No neck masses.  Clear lung apices. Intervertebral disc spaces are maintained throughout. Craniocervical junction unremarkable. IMPRESSION: Negative. Original Report Authenticated By: Davonna Belling, M.D.    Assessment & Plan:   Diagnoses and all orders for this visit:  Need for prophylactic vaccination and inoculation against viral hepatitis -     Hepatitis B vaccine adult IM   I am having Nicholas Mclean maintain his ondansetron, Vitamin D3, hyoscyamine, chlordiazePOXIDE, ciprofloxacin,  promethazine, Eluxadoline, and Vitamin D (Ergocalciferol).  No orders of the defined types were placed in this encounter.    Follow-up: No Follow-up on file.  Sonda Primes, MD

## 2015-08-29 ENCOUNTER — Encounter: Payer: Self-pay | Admitting: Internal Medicine

## 2015-08-29 ENCOUNTER — Ambulatory Visit (INDEPENDENT_AMBULATORY_CARE_PROVIDER_SITE_OTHER): Payer: BLUE CROSS/BLUE SHIELD | Admitting: Internal Medicine

## 2015-08-29 DIAGNOSIS — E559 Vitamin D deficiency, unspecified: Secondary | ICD-10-CM

## 2015-08-29 DIAGNOSIS — F411 Generalized anxiety disorder: Secondary | ICD-10-CM | POA: Diagnosis not present

## 2015-08-29 DIAGNOSIS — K589 Irritable bowel syndrome without diarrhea: Secondary | ICD-10-CM | POA: Diagnosis not present

## 2015-08-29 NOTE — Progress Notes (Signed)
Subjective:  Patient ID: Nicholas Mclean, male    DOB: [birth year]  Age: 25 y.o. MRN: [id number]  CC: No chief complaint on file.   HPI Nicholas ReamsBrett Mclean presents for IBS and anxiety  Outpatient Medications Prior to Visit  Medication Sig Dispense Refill  . chlordiazePOXIDE (LIBRIUM) 10 MG capsule Take 1 capsule (10 mg total) by mouth 3 (three) times daily as needed for anxiety. 90 capsule 3  . Cholecalciferol (VITAMIN D3) 2000 UNITS capsule Take 1 capsule (2,000 Units total) by mouth daily. 100 capsule 3  . ciprofloxacin (CIPRO) 500 MG tablet Take 1 tablet (500 mg total) by mouth 2 (two) times daily. 20 tablet 0  . hyoscyamine (LEVSIN) 0.125 MG tablet Take 1-2 tablets (0.125-0.25 mg total) by mouth every 6 (six) hours as needed for cramping. 60 tablet 3  . ondansetron (ZOFRAN) 4 MG tablet Take 1 tablet (4 mg total) by mouth every 8 (eight) hours as needed for nausea or vomiting. 20 tablet 0  . promethazine (PHENERGAN) 25 MG tablet Take 1 tablet (25 mg total) by mouth every 8 (eight) hours as needed for nausea or vomiting. 60 tablet 0  . Vitamin D, Ergocalciferol, (DRISDOL) 50000 units CAPS capsule Take 1 capsule (50,000 Units total) by mouth once a week. q 1 month 3 capsule 5  . Eluxadoline (VIBERZI) 100 MG TABS Take 100 mg by mouth 2 (two) times daily. (Patient not taking: Reported on 08/29/2015) 60 tablet 5   No facility-administered medications prior to visit.     ROS Review of Systems  Constitutional: Negative for appetite change, fatigue and unexpected weight change.  HENT: Negative for congestion, nosebleeds, sneezing, sore throat and trouble swallowing.   Eyes: Negative for itching and visual disturbance.  Respiratory: Negative for cough.   Cardiovascular: Negative for chest pain, palpitations and leg swelling.  Gastrointestinal: Negative for abdominal distention, blood in stool, diarrhea and nausea.  Genitourinary: Negative for frequency and hematuria.  Musculoskeletal: Negative for  back pain, gait problem, joint swelling and neck pain.  Skin: Negative for rash.  Neurological: Negative for dizziness, tremors, speech difficulty and weakness.  Psychiatric/Behavioral: Negative for agitation, dysphoric mood, sleep disturbance and suicidal ideas. The patient is nervous/anxious.     Objective:  BP 120/70   Pulse 62   Wt 165 lb (74.8 kg)   SpO2 96%   BMI 22.38 kg/m   BP Readings from Last 3 Encounters:  08/29/15 120/70  05/31/15 120/88  01/03/15 120/78    Wt Readings from Last 3 Encounters:  08/29/15 165 lb (74.8 kg)  05/31/15 160 lb (72.6 kg)  01/03/15 157 lb (71.2 kg)    Physical Exam  Constitutional: He is oriented to person, place, and time. He appears well-developed. No distress.  NAD  HENT:  Mouth/Throat: Oropharynx is clear and moist.  Eyes: Conjunctivae are normal. Pupils are equal, round, and reactive to light.  Neck: Normal range of motion. No JVD present. No thyromegaly present.  Cardiovascular: Normal rate, regular rhythm, normal heart sounds and intact distal pulses.  Exam reveals no gallop and no friction rub.   No murmur heard. Pulmonary/Chest: Effort normal and breath sounds normal. No respiratory distress. He has no wheezes. He has no rales. He exhibits no tenderness.  Abdominal: Soft. Bowel sounds are normal. He exhibits no distension and no mass. There is no tenderness. There is no rebound and no guarding.  Musculoskeletal: Normal range of motion. He exhibits no edema or tenderness.  Lymphadenopathy:    He has no cervical  adenopathy.  Neurological: He is alert and oriented to person, place, and time. He has normal reflexes. No cranial nerve deficit. He exhibits normal muscle tone. He displays a negative Romberg sign. Coordination and gait normal.  Skin: Skin is warm and dry. No rash noted.  Psychiatric: He has a normal mood and affect. His behavior is normal. Judgment and thought content normal.    Lab Results  Component Value Date    WBC 9.9 01/03/2015   HGB 15.6 01/03/2015   HCT 46.1 01/03/2015   PLT 204.0 01/03/2015   GLUCOSE 89 01/03/2015   CHOL 170 01/03/2015   TRIG 101.0 01/03/2015   HDL 52.00 01/03/2015   LDLCALC 98 01/03/2015   ALT 32 01/03/2015   AST 40 (H) 01/03/2015   NA 138 01/03/2015   K 3.7 01/03/2015   CL 105 01/03/2015   CREATININE 0.90 01/03/2015   BUN 17 01/03/2015   CO2 21 01/03/2015   TSH 3.61 01/03/2015    Dg Cervical Spine Complete  Result Date: 01/23/2012 *RADIOLOGY REPORT* Clinical Data: Fall.  Neck pain. CERVICAL SPINE - COMPLETE 4+ VIEW Comparison: None. Findings: Vertebral body height and alignment are normal. Intervertebral disc space height is maintained.  Prevertebral soft tissues appear normal.  Lung apices clear. IMPRESSION: Negative exam. Original Report Authenticated By: Holley Dexterhomas D'Alessio, M.D.   Ct Head Wo Contrast  Result Date: 01/23/2012 *RADIOLOGY REPORT* Clinical Data:  Larey SeatFell down steps.  Head pain.  Neck pain. CT HEAD WITHOUT CONTRAST CT CERVICAL SPINE WITHOUT CONTRAST Technique:  Multidetector CT imaging of the head and cervical spine was performed following the standard protocol without intravenous contrast.  Multiplanar CT image reconstructions of the cervical spine were also generated. Comparison:   None CT HEAD Findings: There is no evidence for acute infarction, intracranial hemorrhage, mass lesion, hydrocephalus, or extra-axial fluid. There is no atrophy or white matter disease.  Calvarium is intact. There is a left frontal supraorbital scalp hematoma without underlying sinus opacity or visible orbital injury. IMPRESSION: Left frontal scalp hematoma.  No skull fracture or intracranial hemorrhage. CT CERVICAL SPINE Findings: No visible cervical spine fracture or traumatic subluxation.  No intraspinal hematoma or traumatic subluxation. The  airway is midline.  No neck masses.  Clear lung apices. Intervertebral disc spaces are maintained throughout. Craniocervical junction  unremarkable. IMPRESSION: Negative. Original Report Authenticated By: Davonna BellingJohn Curnes, M.D.   Ct Cervical Spine Wo Contrast  Result Date: 01/23/2012 *RADIOLOGY REPORT* Clinical Data:  Larey SeatFell down steps.  Head pain.  Neck pain. CT HEAD WITHOUT CONTRAST CT CERVICAL SPINE WITHOUT CONTRAST Technique:  Multidetector CT imaging of the head and cervical spine was performed following the standard protocol without intravenous contrast.  Multiplanar CT image reconstructions of the cervical spine were also generated. Comparison:   None CT HEAD Findings: There is no evidence for acute infarction, intracranial hemorrhage, mass lesion, hydrocephalus, or extra-axial fluid. There is no atrophy or white matter disease.  Calvarium is intact. There is a left frontal supraorbital scalp hematoma without underlying sinus opacity or visible orbital injury. IMPRESSION: Left frontal scalp hematoma.  No skull fracture or intracranial hemorrhage. CT CERVICAL SPINE Findings: No visible cervical spine fracture or traumatic subluxation.  No intraspinal hematoma or traumatic subluxation. The  airway is midline.  No neck masses.  Clear lung apices. Intervertebral disc spaces are maintained throughout. Craniocervical junction unremarkable. IMPRESSION: Negative. Original Report Authenticated By: Davonna BellingJohn Curnes, M.D.    Assessment & Plan:   There are no diagnoses linked to this encounter. I am  having Nicholas Mclean maintain his ondansetron, Vitamin D3, hyoscyamine, chlordiazePOXIDE, ciprofloxacin, promethazine, Eluxadoline, and Vitamin D (Ergocalciferol).  No orders of the defined types were placed in this encounter.    Follow-up: No Follow-up on file.  Sonda Primes, MD

## 2015-08-29 NOTE — Assessment & Plan Note (Signed)
Librax prn rare

## 2015-08-29 NOTE — Assessment & Plan Note (Signed)
Vit D 

## 2015-08-29 NOTE — Assessment & Plan Note (Signed)
Viberzi made him not feel well - he stopped Librax prn

## 2015-08-29 NOTE — Progress Notes (Signed)
Pre visit review using our clinic review tool, if applicable. No additional management support is needed unless otherwise documented below in the visit note. 

## 2023-01-02 ENCOUNTER — Encounter: Payer: Self-pay | Admitting: Internal Medicine

## 2023-01-02 ENCOUNTER — Ambulatory Visit: Payer: BLUE CROSS/BLUE SHIELD | Admitting: Internal Medicine

## 2023-01-02 VITALS — BP 120/78 | HR 80 | Temp 98.6°F | Ht 72.0 in | Wt 151.0 lb

## 2023-01-02 DIAGNOSIS — R634 Abnormal weight loss: Secondary | ICD-10-CM | POA: Diagnosis not present

## 2023-01-02 DIAGNOSIS — R1013 Epigastric pain: Secondary | ICD-10-CM | POA: Diagnosis not present

## 2023-01-02 DIAGNOSIS — G8929 Other chronic pain: Secondary | ICD-10-CM

## 2023-01-02 DIAGNOSIS — R5382 Chronic fatigue, unspecified: Secondary | ICD-10-CM | POA: Diagnosis not present

## 2023-01-02 DIAGNOSIS — R5381 Other malaise: Secondary | ICD-10-CM

## 2023-01-02 LAB — COMPREHENSIVE METABOLIC PANEL
ALT: 15 U/L (ref 0–53)
AST: 17 U/L (ref 0–37)
Albumin: 4.9 g/dL (ref 3.5–5.2)
Alkaline Phosphatase: 66 U/L (ref 39–117)
BUN: 16 mg/dL (ref 6–23)
CO2: 25 meq/L (ref 19–32)
Calcium: 9 mg/dL (ref 8.4–10.5)
Chloride: 105 meq/L (ref 96–112)
Creatinine, Ser: 1.13 mg/dL (ref 0.40–1.50)
GFR: 86.78 mL/min (ref >60.00)
Glucose, Bld: 83 mg/dL (ref 70–99)
Potassium: 3.8 meq/L (ref 3.5–5.1)
Sodium: 140 meq/L (ref 135–145)
Total Bilirubin: 0.8 mg/dL (ref 0.2–1.2)
Total Protein: 7.4 g/dL (ref 6.0–8.3)

## 2023-01-02 LAB — CBC WITH DIFFERENTIAL/PLATELET
Basophils Absolute: 0 10*3/uL (ref 0.0–0.1)
Basophils Relative: 0.3 % (ref 0.0–3.0)
Eosinophils Absolute: 0 10*3/uL (ref 0.0–0.7)
Eosinophils Relative: 0.4 % (ref 0.0–5.0)
HCT: 44 % (ref 39.0–52.0)
Hemoglobin: 15.2 g/dL (ref 13.0–17.0)
Lymphocytes Relative: 18.8 % (ref 12.0–46.0)
Lymphs Abs: 1.9 10*3/uL (ref 0.7–4.0)
MCHC: 34.5 g/dL (ref 30.0–36.0)
MCV: 92.2 fL (ref 78.0–100.0)
Monocytes Absolute: 1.1 10*3/uL — ABNORMAL HIGH (ref 0.1–1.0)
Monocytes Relative: 10.8 % (ref 3.0–12.0)
Neutro Abs: 7.2 10*3/uL (ref 1.4–7.7)
Neutrophils Relative %: 69.7 % (ref 43.0–77.0)
Platelets: 246 10*3/uL (ref 150.0–400.0)
RBC: 4.77 Mil/uL (ref 4.22–5.81)
RDW: 13.5 % (ref 11.5–15.5)
WBC: 10.3 10*3/uL (ref 4.0–10.5)

## 2023-01-02 LAB — TESTOSTERONE: Testosterone: 396.22 ng/dL (ref 300.00–890.00)

## 2023-01-02 LAB — VITAMIN D 25 HYDROXY (VIT D DEFICIENCY, FRACTURES): VITD: 41.8 ng/mL (ref 30.00–100.00)

## 2023-01-02 LAB — URINALYSIS
Bilirubin Urine: NEGATIVE
Hgb urine dipstick: NEGATIVE
Ketones, ur: 15 — AB
Leukocytes,Ua: NEGATIVE
Nitrite: NEGATIVE
Specific Gravity, Urine: >=1.03 — AB (ref 1.000–1.030)
Urine Glucose: NEGATIVE
Urobilinogen, UA: 0.2 (ref 0.0–1.0)
pH: 6 (ref 5.0–8.0)

## 2023-01-02 LAB — VITAMIN B12: Vitamin B-12: 950 pg/mL — ABNORMAL HIGH (ref 211–911)

## 2023-01-02 LAB — T4, FREE: Free T4: 0.97 ng/dL (ref 0.60–1.60)

## 2023-01-02 LAB — LUTEINIZING HORMONE: LH: 5.7 m[IU]/mL (ref 1.50–9.30)

## 2023-01-02 LAB — FOLLICLE STIMULATING HORMONE: FSH: 3.5 m[IU]/mL (ref 1.4–18.1)

## 2023-01-02 MED ORDER — TADALAFIL 20 MG PO TABS: 10.0000 mg | ORAL_TABLET | ORAL | 3 refills | Status: DC | PRN

## 2023-01-02 MED ORDER — PANTOPRAZOLE SODIUM 40 MG PO TBEC: 40.0000 mg | DELAYED_RELEASE_TABLET | Freq: Every day | ORAL | 11 refills | Status: AC

## 2023-01-02 NOTE — Progress Notes (Signed)
Subjective:  Patient ID: Nicholas Mclean, male    DOB: [birth year]  Age: 32 y.o. MRN: [id number]  CC: New Patient (Initial Visit)   HPI Nicholas Mclean presents for a new pt - low energy Dating x 3 wks C/o stress, anxiety C/o ED - new C/o stomach issues - GERD, nausea in am, epigastric pain Pt lost 10 lbs   Outpatient Medications Prior to Visit  Medication Sig Dispense Refill   chlordiazePOXIDE (LIBRIUM) 10 MG capsule Take 1 capsule (10 mg total) by mouth 3 (three) times daily as needed for anxiety. 90 capsule 3   hyoscyamine (LEVSIN) 0.125 MG tablet Take 1-2 tablets (0.125-0.25 mg total) by mouth every 6 (six) hours as needed for cramping. 60 tablet 3   promethazine (PHENERGAN) 25 MG tablet Take 1 tablet (25 mg total) by mouth every 8 (eight) hours as needed for nausea or vomiting. 60 tablet 0   Vitamin D, Ergocalciferol, (DRISDOL) 50000 units CAPS capsule Take 1 capsule (50,000 Units total) by mouth once a week. q 1 month 3 capsule 5   No facility-administered medications prior to visit.    ROS: Review of Systems  Constitutional:  Positive for fatigue. Negative for appetite change and unexpected weight change.  HENT:  Negative for congestion, nosebleeds, sneezing, sore throat and trouble swallowing.   Eyes:  Negative for itching and visual disturbance.  Respiratory:  Negative for cough.   Cardiovascular:  Negative for chest pain, palpitations and leg swelling.  Gastrointestinal:  Negative for abdominal distention, blood in stool, diarrhea and nausea.  Genitourinary:  Negative for frequency and hematuria.  Musculoskeletal:  Negative for back pain, gait problem, joint swelling and neck pain.  Skin:  Negative for rash.  Neurological:  Negative for dizziness, tremors, speech difficulty and weakness.  Psychiatric/Behavioral:  Negative for agitation, dysphoric mood, sleep disturbance and suicidal ideas. The patient is not nervous/anxious.     Objective:  BP 120/78 (BP Location:  Right Arm, Patient Position: Sitting, Cuff Size: Normal)   Pulse 80   Temp 98.6 F (37 C) (Oral)   Ht 6' (1.829 m)   Wt 151 lb (68.5 kg)   SpO2 96%   BMI 20.48 kg/m   BP Readings from Last 3 Encounters:  01/02/23 120/78  08/29/15 120/70  05/31/15 120/88    Wt Readings from Last 3 Encounters:  01/02/23 151 lb (68.5 kg)  08/29/15 165 lb (74.8 kg)  05/31/15 160 lb (72.6 kg)    Physical Exam Constitutional:      General: He is not in acute distress.    Appearance: Normal appearance. He is well-developed.     Comments: NAD  Eyes:     Conjunctiva/sclera: Conjunctivae normal.     Pupils: Pupils are equal, round, and reactive to light.  Neck:     Thyroid: No thyromegaly.     Vascular: No JVD.  Cardiovascular:     Rate and Rhythm: Normal rate and regular rhythm.     Heart sounds: Normal heart sounds. No murmur heard.    No friction rub. No gallop.  Pulmonary:     Effort: Pulmonary effort is normal. No respiratory distress.     Breath sounds: Normal breath sounds. No wheezing or rales.  Chest:     Chest wall: No tenderness.  Abdominal:     General: Bowel sounds are normal. There is no distension.     Palpations: Abdomen is soft. There is no mass.     Tenderness: There is no abdominal tenderness. There is  no guarding or rebound.  Musculoskeletal:        General: No tenderness. Normal range of motion.     Cervical back: Normal range of motion.  Lymphadenopathy:     Cervical: No cervical adenopathy.  Skin:    General: Skin is warm and dry.     Findings: No rash.  Neurological:     Mental Status: He is alert and oriented to person, place, and time.     Cranial Nerves: No cranial nerve deficit.     Motor: No abnormal muscle tone.     Coordination: Coordination normal.     Gait: Gait normal.     Deep Tendon Reflexes: Reflexes are normal and symmetric.  Psychiatric:        Behavior: Behavior normal.        Thought Content: Thought content normal.        Judgment:  Judgment normal.   Testes -- softer B    A total time of 60 minutes was spent preparing to see the patient, reviewing tests, x-rays, operative reports and other medical records.  Also, obtaining history and performing comprehensive physical exam.  Additionally, counseling the patient regarding the above listed issues.   Finally, documenting clinical information in the health records, coordination of care, educating the patient re ED, weakness, abd pain. It is a complex case.   Lab Results  Component Value Date   WBC 9.9 01/03/2015   HGB 15.6 01/03/2015   HCT 46.1 01/03/2015   PLT 204.0 01/03/2015   GLUCOSE 89 01/03/2015   CHOL 170 01/03/2015   TRIG 101.0 01/03/2015   HDL 52.00 01/03/2015   LDLCALC 98 01/03/2015   ALT 32 01/03/2015   AST 40 (H) 01/03/2015   NA 138 01/03/2015   K 3.7 01/03/2015   CL 105 01/03/2015   CREATININE 0.90 01/03/2015   BUN 17 01/03/2015   CO2 21 01/03/2015   TSH 3.61 01/03/2015    CT Head Wo Contrast Result Date: 01/23/2012 *RADIOLOGY REPORT* Clinical Data:  Larey Seat down steps.  Head pain.  Neck pain. CT HEAD WITHOUT CONTRAST CT CERVICAL SPINE WITHOUT CONTRAST Technique:  Multidetector CT imaging of the head and cervical spine was performed following the standard protocol without intravenous contrast.  Multiplanar CT image reconstructions of the cervical spine were also generated. Comparison:   None CT HEAD Findings: There is no evidence for acute infarction, intracranial hemorrhage, mass lesion, hydrocephalus, or extra-axial fluid. There is no atrophy or white matter disease.  Calvarium is intact. There is a left frontal supraorbital scalp hematoma without underlying sinus opacity or visible orbital injury. IMPRESSION: Left frontal scalp hematoma.  No skull fracture or intracranial hemorrhage. CT CERVICAL SPINE Findings: No visible cervical spine fracture or traumatic subluxation.  No intraspinal hematoma or traumatic subluxation. The  airway is midline.  No neck  masses.  Clear lung apices. Intervertebral disc spaces are maintained throughout. Craniocervical junction unremarkable. IMPRESSION: Negative. Original Report Authenticated By: Davonna Belling, M.D.   CT Cervical Spine Wo Contrast Result Date: 01/23/2012 *RADIOLOGY REPORT* Clinical Data:  Larey Seat down steps.  Head pain.  Neck pain. CT HEAD WITHOUT CONTRAST CT CERVICAL SPINE WITHOUT CONTRAST Technique:  Multidetector CT imaging of the head and cervical spine was performed following the standard protocol without intravenous contrast.  Multiplanar CT image reconstructions of the cervical spine were also generated. Comparison:   None CT HEAD Findings: There is no evidence for acute infarction, intracranial hemorrhage, mass lesion, hydrocephalus, or extra-axial fluid. There is no atrophy  or white matter disease.  Calvarium is intact. There is a left frontal supraorbital scalp hematoma without underlying sinus opacity or visible orbital injury. IMPRESSION: Left frontal scalp hematoma.  No skull fracture or intracranial hemorrhage. CT CERVICAL SPINE Findings: No visible cervical spine fracture or traumatic subluxation.  No intraspinal hematoma or traumatic subluxation. The  airway is midline.  No neck masses.  Clear lung apices. Intervertebral disc spaces are maintained throughout. Craniocervical junction unremarkable. IMPRESSION: Negative. Original Report Authenticated By: Davonna Belling, M.D.   DG Cervical Spine Complete Result Date: 01/23/2012 *RADIOLOGY REPORT* Clinical Data: Fall.  Neck pain. CERVICAL SPINE - COMPLETE 4+ VIEW Comparison: None. Findings: Vertebral body height and alignment are normal. Intervertebral disc space height is maintained.  Prevertebral soft tissues appear normal.  Lung apices clear. IMPRESSION: Negative exam. Original Report Authenticated By: Holley Dexter, M.D.    Assessment & Plan:   Problem List Items Addressed This Visit     Chronic fatigue and malaise - Primary   Chronic         Relevant Orders   Comprehensive metabolic panel   CBC with Differential/Platelet   Iron, TIBC and Ferritin Panel   Testosterone   T4, free   Vitamin B12   VITAMIN D 25 Hydroxy (Vit-D Deficiency, Fractures)   Urinalysis   Luteinizing hormone   FSH   HIV Antibody (routine testing w rflx)   Follicle stimulating hormone   Abdominal pain, chronic, epigastric   Worse Check labs      Relevant Orders   Comprehensive metabolic panel   CBC with Differential/Platelet   Iron, TIBC and Ferritin Panel   Testosterone   T4, free   Vitamin B12   VITAMIN D 25 Hydroxy (Vit-D Deficiency, Fractures)   Urinalysis   Luteinizing hormone   FSH   HIV Antibody (routine testing w rflx)   Follicle stimulating hormone   H. pylori antigen, stool   Weight loss   Unclear etiology      Relevant Orders   Comprehensive metabolic panel   CBC with Differential/Platelet   Iron, TIBC and Ferritin Panel   Testosterone   T4, free   Vitamin B12   VITAMIN D 25 Hydroxy (Vit-D Deficiency, Fractures)   Urinalysis   Luteinizing hormone   FSH   HIV Antibody (routine testing w rflx)   Follicle stimulating hormone      Meds ordered this encounter  Medications   tadalafil (CIALIS) 20 MG tablet    Sig: Take 0.5-1 tablets (10-20 mg total) by mouth every other day as needed for erectile dysfunction.    Dispense:  12 tablet    Refill:  3   pantoprazole (PROTONIX) 40 MG tablet    Sig: Take 1 tablet (40 mg total) by mouth daily.    Dispense:  30 tablet    Refill:  11      Follow-up: Return in about 4 weeks (around 01/30/2023) for a follow-up visit.  Sonda Primes, MD

## 2023-01-02 NOTE — Assessment & Plan Note (Signed)
Unclear etiology 

## 2023-01-02 NOTE — Assessment & Plan Note (Signed)
Chronic. 

## 2023-01-02 NOTE — Assessment & Plan Note (Signed)
Worse Check labs

## 2023-01-02 NOTE — Patient Instructions (Signed)
There are natural ways to boost your testosterone:  1. Lose Weight If you're overweight, shedding the excess pounds may increase your testosterone levels, according to multiple research. Overweight men are more likely to have low testosterone levels to begin with, so this is an important trick to increase your body's testosterone production when you need it most.   2. Strength Training    Strength training is also known to boost testosterone levels, provided you are doing so intensely enough. When strength training to boost testosterone, you'll want to increase the weight and lower your number of reps, and then focus on exercises that work a large number of muscles.  3. Optimize Your Vitamin D Levels Vitamin D, a steroid hormone, is essential for the healthy development of the nucleus of the sperm cell, and helps maintain semen quality and sperm count. Vitamin D also increases levels of testosterone, which may boost libido. In one study, overweight men who were given vitamin D supplements had a significant increase in testosterone levels after one year.  4. Reduce Stress When you're under a lot of stress, your body releases high levels of the stress hormone cortisol. This hormone actually blocks the effects of testosterone, presumably because, from a biological standpoint, testosterone-associated behaviors (mating, competing, aggression) may have lowered your chances of survival in an emergency (hence, the "fight or flight" response is dominant, courtesy of cortisol).  5. Limit or Eliminate Sugar from Your Diet Testosterone levels decrease after you eat sugar, which is likely because the sugar leads to a high insulin level, another factor leading to low testosterone.  6. Eat Healthy Fats By healthy, this means not only mon- and polyunsaturated fats, like that found in avocadoes and nuts, but also saturated, as these are essential for building testosterone. Research shows that a diet with less than  40 percent of energy as fat (and that mainly from animal sources, i.e. saturated) lead to a decrease in testosterone levels.  It's important to understand that your body requires saturated fats from animal and vegetable sources (such as meat, dairy, certain oils, and tropical plants like coconut) for optimal functioning, and if you neglect this important food group in favor of sugar, grains and other starchy carbs, your health and weight are almost guaranteed to suffer. Examples of healthy fats you can eat more of to give your testosterone levels a boost include:  Olives and Olive oil  Coconuts and coconut oil Butter made from organic milk  Raw nuts, such as, almonds or pecans Eggs Avocados   Meats Palm oil Unheated organic nut oils   7. "Testosterone boosters" containing Vitamin D-3, Niacin, Vitamin B-6, Vitamin B-12, Magnesium, Zinc, Selenium, D-Aspartic Acid, Fenugreed Seed Extract, Oystershell, Suma Extract, Siberian Ginseng may be helpful as well.   

## 2023-01-03 ENCOUNTER — Encounter: Payer: Self-pay | Admitting: Internal Medicine

## 2023-01-03 LAB — IRON,TIBC AND FERRITIN PANEL
%SAT: 35 % (ref 20–48)
Ferritin: 192 ng/mL (ref 38–380)
Iron: 100 ug/dL (ref 50–180)
TIBC: 282 ug/dL (ref 250–425)

## 2023-01-03 LAB — HIV ANTIBODY (ROUTINE TESTING W REFLEX): HIV 1&2 Ab, 4th Generation: NONREACTIVE

## 2023-01-04 ENCOUNTER — Other Ambulatory Visit: Payer: 59

## 2023-01-06 LAB — H. PYLORI ANTIGEN, STOOL: H pylori Ag, Stl: NEGATIVE

## 2023-01-30 ENCOUNTER — Ambulatory Visit: Payer: 59 | Admitting: Internal Medicine

## 2023-01-30 ENCOUNTER — Encounter: Payer: Self-pay | Admitting: Internal Medicine

## 2023-02-18 ENCOUNTER — Ambulatory Visit: Payer: 59 | Admitting: Internal Medicine

## 2024-01-08 ENCOUNTER — Other Ambulatory Visit: Payer: Self-pay | Admitting: Internal Medicine

## 2024-02-17 ENCOUNTER — Encounter

## 2024-02-17 ENCOUNTER — Encounter: Payer: Self-pay | Admitting: Internal Medicine
